# Patient Record
Sex: Female | Born: 1985
Health system: Southern US, Community
[De-identification: ages and names within clinical notes are randomized; demographics above are authoritative.]

## PROBLEM LIST (undated history)

## (undated) DIAGNOSIS — E669 Obesity, unspecified: Secondary | ICD-10-CM

## (undated) DIAGNOSIS — R7303 Prediabetes: Secondary | ICD-10-CM

## (undated) DIAGNOSIS — E559 Vitamin D deficiency, unspecified: Secondary | ICD-10-CM

## (undated) DIAGNOSIS — I10 Essential (primary) hypertension: Secondary | ICD-10-CM

## (undated) HISTORY — DX: Obesity, unspecified: E66.9

## (undated) HISTORY — DX: Vitamin D deficiency, unspecified: E55.9

## (undated) HISTORY — DX: Essential (primary) hypertension: I10

## (undated) HISTORY — DX: Prediabetes: R73.03

---
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
## 2013-10-28 ENCOUNTER — Other Ambulatory Visit: Payer: Self-pay | Admitting: Internal Medicine

## 2013-10-28 DIAGNOSIS — R946 Abnormal results of thyroid function studies: Secondary | ICD-10-CM

## 2013-10-29 ENCOUNTER — Ambulatory Visit
Admission: RE | Admit: 2013-10-29 | Discharge: 2013-10-29 | Disposition: A | Discharge status: Home / Self Care | Payer: BC Managed Care – PPO | Source: Ambulatory Visit | Attending: Internal Medicine | Admitting: Internal Medicine

## 2013-10-29 DIAGNOSIS — R946 Abnormal results of thyroid function studies: Secondary | ICD-10-CM

## 2013-12-24 ENCOUNTER — Other Ambulatory Visit: Payer: Self-pay | Admitting: Endocrinology

## 2013-12-24 DIAGNOSIS — E049 Nontoxic goiter, unspecified: Secondary | ICD-10-CM

## 2014-06-25 ENCOUNTER — Other Ambulatory Visit: Payer: BC Managed Care – PPO

## 2014-06-30 ENCOUNTER — Other Ambulatory Visit: Payer: BC Managed Care – PPO

## 2014-07-01 ENCOUNTER — Ambulatory Visit
Admission: RE | Admit: 2014-07-01 | Discharge: 2014-07-01 | Disposition: A | Discharge status: Home / Self Care | Payer: BC Managed Care – PPO | Source: Ambulatory Visit | Attending: Endocrinology | Admitting: Endocrinology

## 2014-07-01 DIAGNOSIS — E049 Nontoxic goiter, unspecified: Secondary | ICD-10-CM

## 2014-12-10 ENCOUNTER — Other Ambulatory Visit: Payer: Self-pay | Admitting: Endocrinology

## 2014-12-10 DIAGNOSIS — E049 Nontoxic goiter, unspecified: Secondary | ICD-10-CM

## 2015-06-30 ENCOUNTER — Other Ambulatory Visit: Payer: BC Managed Care – PPO

## 2015-07-07 ENCOUNTER — Ambulatory Visit
Admission: RE | Admit: 2015-07-07 | Discharge: 2015-07-07 | Disposition: A | Discharge status: Home / Self Care | Payer: BC Managed Care – PPO | Source: Ambulatory Visit | Attending: Endocrinology | Admitting: Endocrinology

## 2015-07-07 DIAGNOSIS — E049 Nontoxic goiter, unspecified: Secondary | ICD-10-CM

## 2016-07-19 DIAGNOSIS — E669 Obesity, unspecified: Secondary | ICD-10-CM | POA: Insufficient documentation

## 2016-07-19 DIAGNOSIS — Z6836 Body mass index (BMI) 36.0-36.9, adult: Secondary | ICD-10-CM | POA: Insufficient documentation

## 2017-08-06 ENCOUNTER — Other Ambulatory Visit: Payer: Self-pay | Admitting: Internal Medicine

## 2017-08-06 DIAGNOSIS — E041 Nontoxic single thyroid nodule: Secondary | ICD-10-CM

## 2017-08-24 ENCOUNTER — Ambulatory Visit
Admission: RE | Admit: 2017-08-24 | Discharge: 2017-08-24 | Disposition: A | Discharge status: Home / Self Care | Payer: BC Managed Care – PPO | Source: Ambulatory Visit | Attending: Internal Medicine | Admitting: Internal Medicine

## 2017-08-24 DIAGNOSIS — E041 Nontoxic single thyroid nodule: Secondary | ICD-10-CM

## 2018-05-16 ENCOUNTER — Encounter: Payer: Self-pay | Admitting: Internal Medicine

## 2018-06-06 ENCOUNTER — Other Ambulatory Visit: Payer: Self-pay | Admitting: Internal Medicine

## 2018-06-06 NOTE — Telephone Encounter (Signed)
Phentermine refill. 

## 2018-06-17 ENCOUNTER — Encounter: Payer: Self-pay | Admitting: Internal Medicine

## 2018-06-25 ENCOUNTER — Encounter: Payer: Self-pay | Admitting: Internal Medicine

## 2018-07-18 ENCOUNTER — Other Ambulatory Visit: Payer: Self-pay | Admitting: Internal Medicine

## 2018-11-26 ENCOUNTER — Telehealth: Payer: Self-pay

## 2018-11-26 NOTE — Telephone Encounter (Signed)
Pt consented to a virtual visit 11/26/18

## 2018-12-04 ENCOUNTER — Ambulatory Visit: Payer: Self-pay | Admitting: Internal Medicine

## 2019-09-04 ENCOUNTER — Ambulatory Visit: Payer: BC Managed Care – PPO

## 2019-09-08 ENCOUNTER — Ambulatory Visit: Payer: BC Managed Care – PPO | Attending: Family

## 2019-09-08 DIAGNOSIS — Z23 Encounter for immunization: Secondary | ICD-10-CM | POA: Insufficient documentation

## 2019-09-08 NOTE — Progress Notes (Signed)
   Covid-19 Vaccination Clinic  Name:  Kathleen Quinn    MRN: [id number] DOB: January 31, 1986  09/08/2019  [name] was observed post Covid-19 immunization for 15 minutes without incidence. She was provided with Vaccine Information Sheet and instruction to access the V-Safe system.   [name] was instructed to call 911 with any severe reactions post vaccine: Marland Kitchen Difficulty breathing  . Swelling of your face and throat  . A fast heartbeat  . A bad rash all over your body  . Dizziness and weakness    Immunizations Administered    Name Date Dose VIS Date Route   Moderna COVID-19 Vaccine 09/08/2019  9:15 AM 0.5 mL 06/17/2019 Intramuscular   Manufacturer: Moderna   Lot: [id number]   NDC: 89381-017-51

## 2019-10-07 ENCOUNTER — Ambulatory Visit: Payer: BC Managed Care – PPO | Attending: Family

## 2019-10-07 DIAGNOSIS — Z23 Encounter for immunization: Secondary | ICD-10-CM

## 2019-10-07 NOTE — Progress Notes (Signed)
   Covid-19 Vaccination Clinic  Name:  Kathleen Quinn    MRN: [id number] DOB: 1986/07/03  10/07/2019  [name] was observed post Covid-19 immunization for 15 minutes without incident. She was provided with Vaccine Information Sheet and instruction to access the V-Safe system.   [name] was instructed to call 911 with any severe reactions post vaccine: Marland Kitchen Difficulty breathing  . Swelling of face and throat  . A fast heartbeat  . A bad rash all over body  . Dizziness and weakness   Immunizations Administered    Name Date Dose VIS Date Route   Moderna COVID-19 Vaccine 10/07/2019 11:44 AM 0.5 mL 06/17/2019 Intramuscular   Manufacturer: Moderna   Lot: [id number]   NDC: 00634-949-44

## 2020-01-30 IMAGING — US US THYROID
1 series · 14 of 25 positions shown · non-contrast
Comparison: 07/07/2015 and previous back to 10/29/2013

CLINICAL DATA: Follow-up thyroid

EXAM:
THYROID ULTRASOUND
TECHNIQUE: Ultrasound examination of the thyroid gland and adjacent soft
tissues was performed.

[Series 1: us thyroid · 0.06mm/px · 14 of 50 slices shown]
[im 1/50]
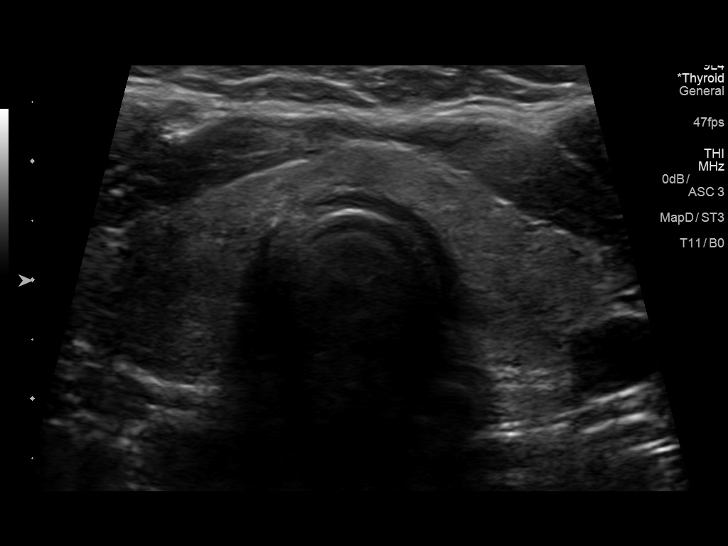
[im 5/50]
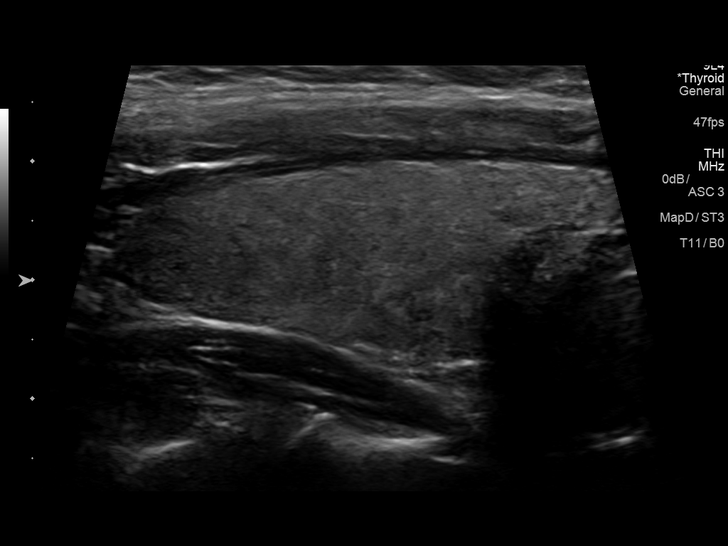
[im 9/50]
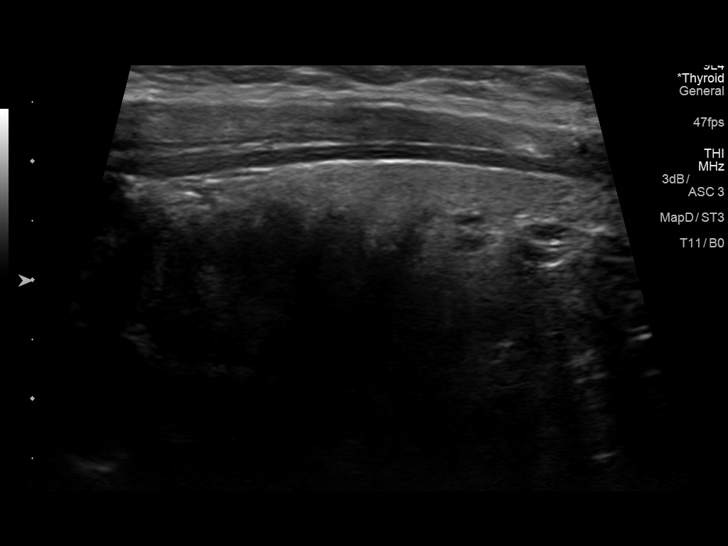
[im 13/50]
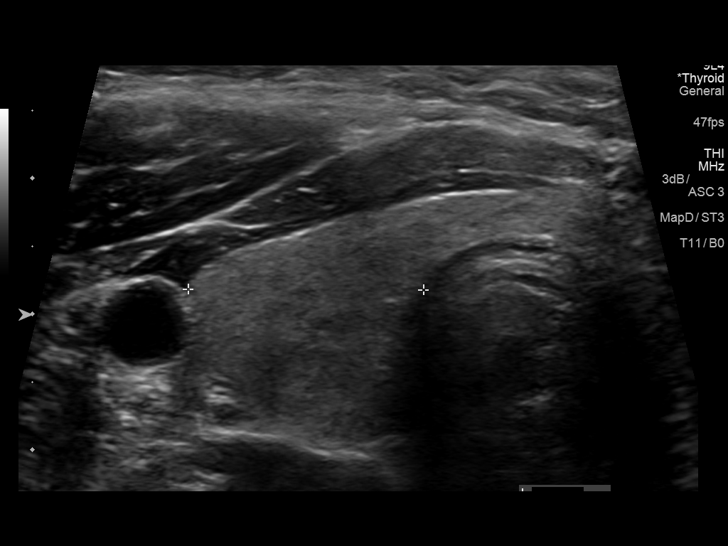
[im 17/50]
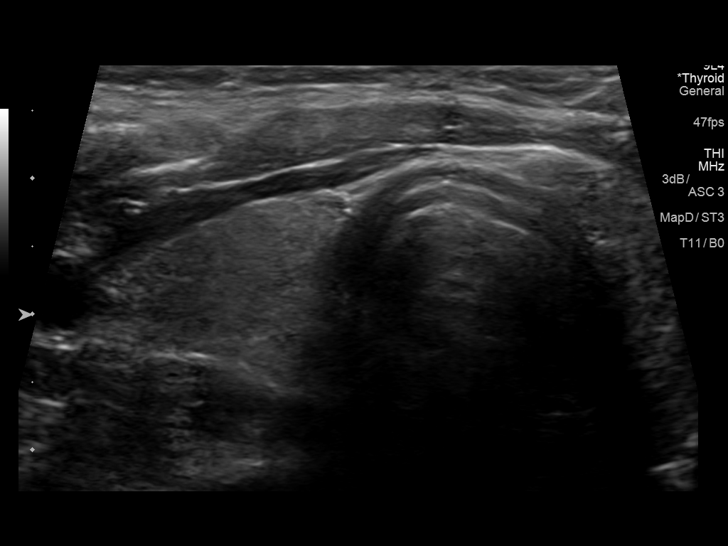
[im 19/50]
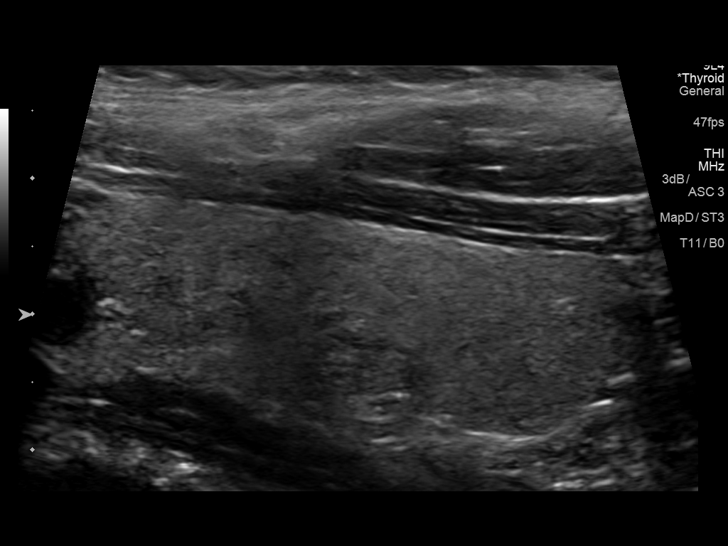
[im 23/50]
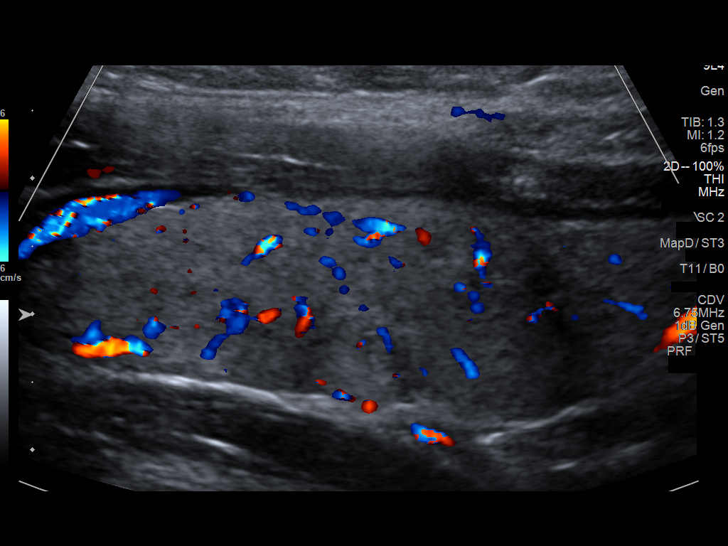
[im 27/50]
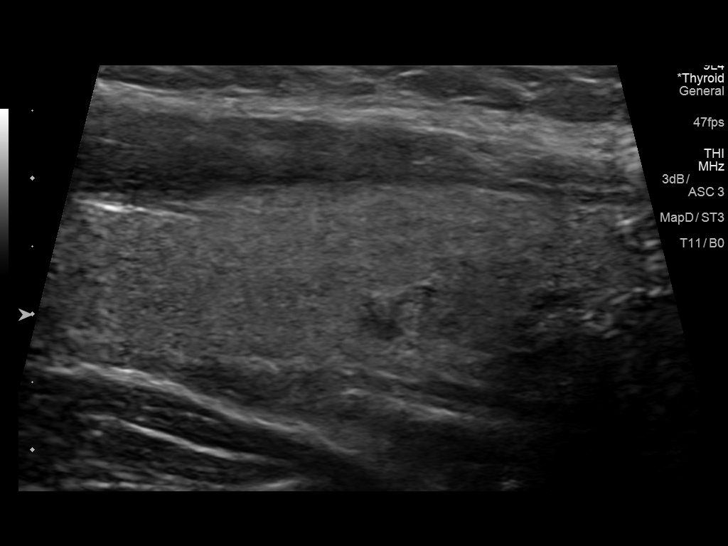
[im 31/50]
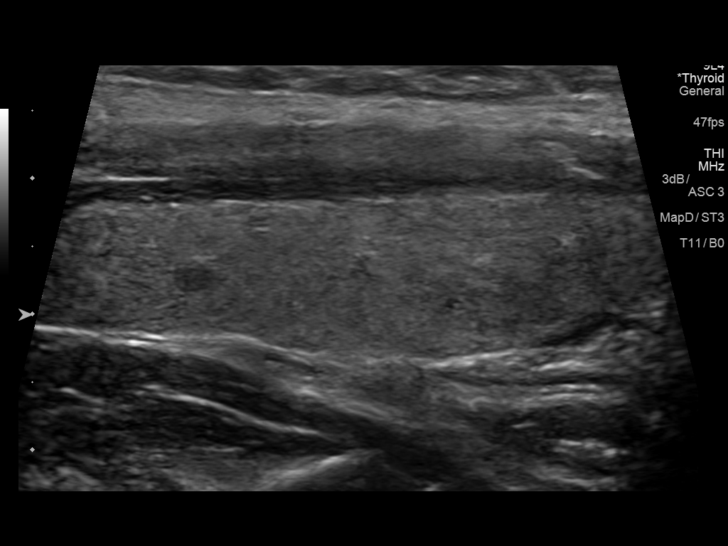
[im 33/50]
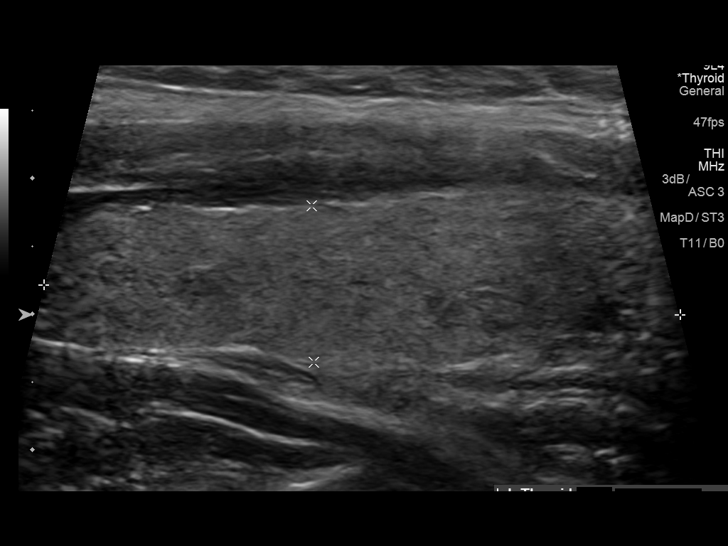
[im 37/50]
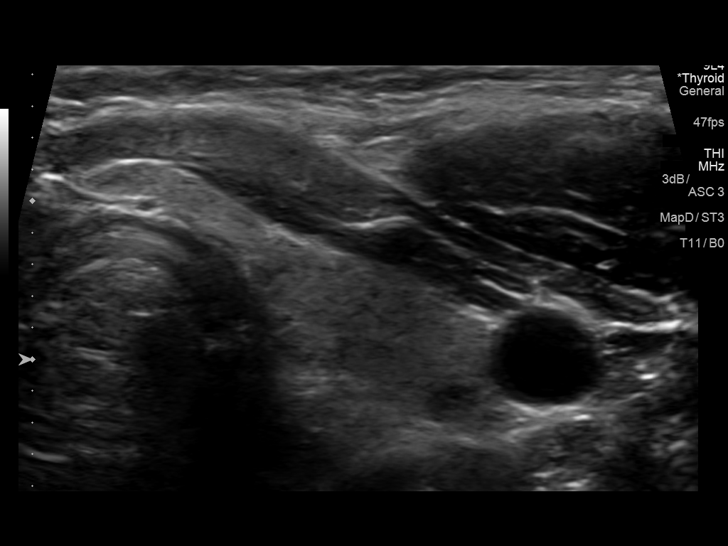
[im 41/50]
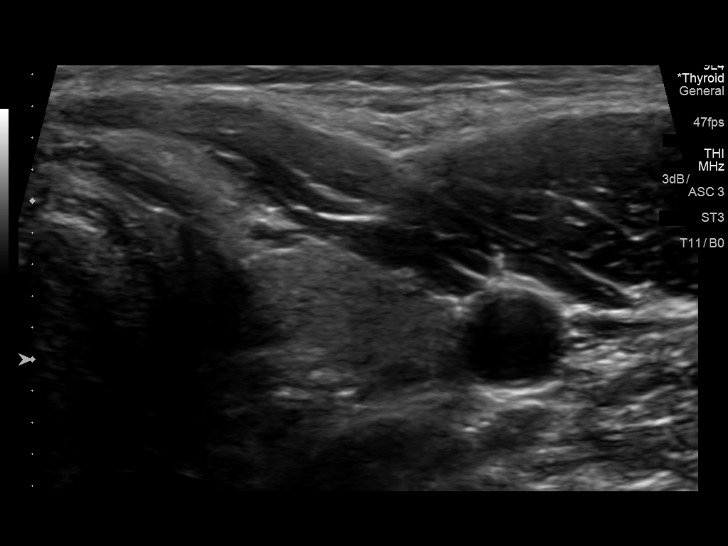
[im 45/50]
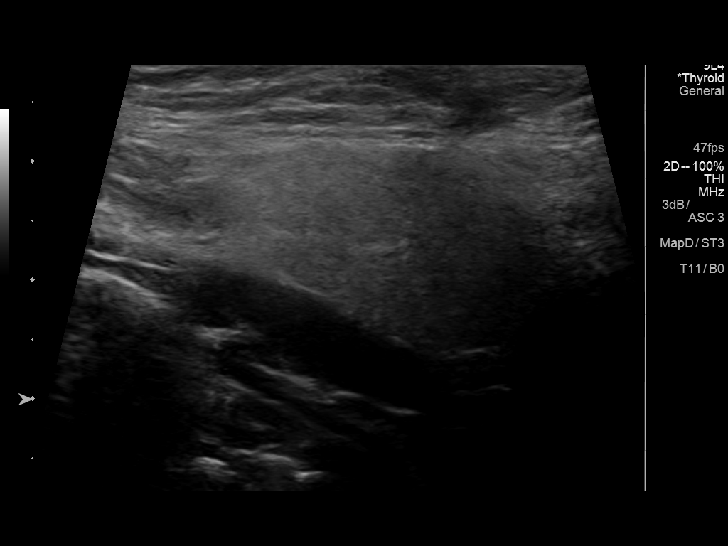
[im 50/50]
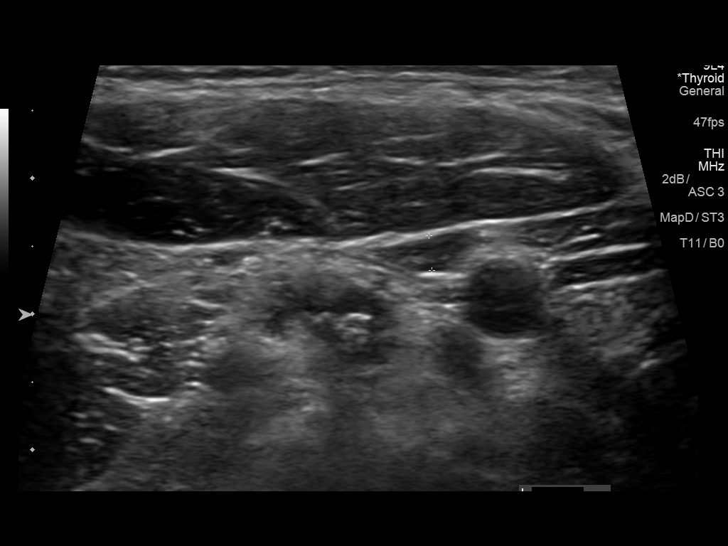

[14 of 25 positions shown; findings below may reference images not displayed]

FINDINGS: Parenchymal Echotexture: Mildly heterogenous

Isthmus: 0.4 cm thickness, stable

Right lobe: 5.4 x 1.7 x 1.7 cm, previously 5.4 x 1.6 x

Left lobe: 4.8 x 1.2 x 1.4 cm, previously 5.2 x 1.1 x

_________________________________________________________

Estimated total number of nodules >/= 1 cm: 0

Number of spongiform nodules >/=  2 cm not described below (TR1): 0

Number of mixed cystic and solid nodules >/= 1.5 cm not described
below (TR2): 0

_________________________________________________________

Isoechoic 0.7 cm mid right nodule with microcalcifications,
previously 0.7 on 10/29/2013

0.3 cm hypoechoic nodule or cyst, superior left;
IMPRESSION: 1. Stable thyromegaly. No worrisome findings to indicate need for
biopsy or dedicated imaging follow-up.

The above is in keeping with the ACR TI-RADS recommendations - [HOSPITAL] 6521;[DATE].

## 2020-04-07 ENCOUNTER — Ambulatory Visit: Payer: BC Managed Care – PPO | Attending: Family

## 2020-04-07 DIAGNOSIS — Z23 Encounter for immunization: Secondary | ICD-10-CM

## 2020-04-26 NOTE — Progress Notes (Signed)
   Covid-19 Vaccination Clinic  Name:  Kathleen Quinn    MRN: [id number] DOB: May 14, 1986  04/26/2020  Kathleen Quinn was observed post Covid-19 immunization for 15 minutes without incident. She was provided with Vaccine Information Sheet and instruction to access the V-Safe system.   Kathleen Quinn was instructed to call 911 with any severe reactions post vaccine: Marland Kitchen Difficulty breathing  . Swelling of face and throat  . A fast heartbeat  . A bad rash all over body  . Dizziness and weakness

## 2020-12-29 ENCOUNTER — Ambulatory Visit (INDEPENDENT_AMBULATORY_CARE_PROVIDER_SITE_OTHER): Payer: 59 | Admitting: Nurse Practitioner

## 2020-12-29 ENCOUNTER — Other Ambulatory Visit: Payer: Self-pay

## 2020-12-29 ENCOUNTER — Encounter: Payer: Self-pay | Admitting: Nurse Practitioner

## 2020-12-29 VITALS — BP 128/80 | HR 83 | Temp 98.4°F | Ht 62.2 in | Wt 198.2 lb

## 2020-12-29 DIAGNOSIS — E559 Vitamin D deficiency, unspecified: Secondary | ICD-10-CM | POA: Diagnosis not present

## 2020-12-29 DIAGNOSIS — Z7689 Persons encountering health services in other specified circumstances: Secondary | ICD-10-CM | POA: Diagnosis not present

## 2020-12-29 DIAGNOSIS — Z0000 Encounter for general adult medical examination without abnormal findings: Secondary | ICD-10-CM

## 2020-12-29 DIAGNOSIS — E6609 Other obesity due to excess calories: Secondary | ICD-10-CM

## 2020-12-29 DIAGNOSIS — Z6836 Body mass index (BMI) 36.0-36.9, adult: Secondary | ICD-10-CM | POA: Diagnosis not present

## 2020-12-29 MED ORDER — PHENTERMINE HCL 37.5 MG PO TABS: ORAL_TABLET | ORAL | 0 refills | Status: DC

## 2020-12-29 NOTE — Patient Instructions (Signed)
Health Maintenance, Female Adopting a healthy lifestyle and getting preventive care are important in promoting health and wellness. Ask your health care provider about: The right schedule for you to have regular tests and exams. Things you can do on your own to prevent diseases and keep yourself healthy. What should I know about diet, weight, and exercise? Eat a healthy diet  Eat a diet that includes plenty of vegetables, fruits, low-fat dairy products, and lean protein. Do not eat a lot of foods that are high in solid fats, added sugars, or sodium.  Maintain a healthy weight Body mass index (BMI) is used to identify weight problems. It estimates body fat based on height and weight. Your health care provider can help determineyour BMI and help you achieve or maintain a healthy weight. Get regular exercise Get regular exercise. This is one of the most important things you can do for your health. Most adults should: Exercise for at least 150 minutes each week. The exercise should increase your heart rate and make you sweat (moderate-intensity exercise). Do strengthening exercises at least twice a week. This is in addition to the moderate-intensity exercise. Spend less time sitting. Even light physical activity can be beneficial. Watch cholesterol and blood lipids Have your blood tested for lipids and cholesterol at 37 years of age, then havethis test every 5 years. Have your cholesterol levels checked more often if: Your lipid or cholesterol levels are high. You are older than 37 years of age. You are at high risk for heart disease. What should I know about cancer screening? Depending on your health history and family history, you may need to have cancer screening at various ages. This may include screening for: Breast cancer. Cervical cancer. Colorectal cancer. Skin cancer. Lung cancer. What should I know about heart disease, diabetes, and high blood pressure? Blood pressure and heart  disease High blood pressure causes heart disease and increases the risk of stroke. This is more likely to develop in people who have high blood pressure readings, are of African descent, or are overweight. Have your blood pressure checked: Every 3-5 years if you are 18-39 years of age. Every year if you are 40 years old or older. Diabetes Have regular diabetes screenings. This checks your fasting blood sugar level. Have the screening done: Once every three years after age 40 if you are at a normal weight and have a low risk for diabetes. More often and at a younger age if you are overweight or have a high risk for diabetes. What should I know about preventing infection? Hepatitis B If you have a higher risk for hepatitis B, you should be screened for this virus. Talk with your health care provider to find out if you are at risk forhepatitis B infection. Hepatitis C Testing is recommended for: Everyone born from 1945 through 1965. Anyone with known risk factors for hepatitis C. Sexually transmitted infections (STIs) Get screened for STIs, including gonorrhea and chlamydia, if: You are sexually active and are younger than 37 years of age. You are older than 37 years of age and your health care provider tells you that you are at risk for this type of infection. Your sexual activity has changed since you were last screened, and you are at increased risk for chlamydia or gonorrhea. Ask your health care provider if you are at risk. Ask your health care provider about whether you are at high risk for HIV. Your health care provider may recommend a prescription medicine to help   prevent HIV infection. If you choose to take medicine to prevent HIV, you should first get tested for HIV. You should then be tested every 3 months for as long as you are taking the medicine. Pregnancy If you are about to stop having your period (premenopausal) and you may become pregnant, seek counseling before you get  pregnant. Take 400 to 800 micrograms (mcg) of folic acid every day if you become pregnant. Ask for birth control (contraception) if you want to prevent pregnancy. Osteoporosis and menopause Osteoporosis is a disease in which the bones lose minerals and strength with aging. This can result in bone fractures. If you are 65 years old or older, or if you are at risk for osteoporosis and fractures, ask your health care provider if you should: Be screened for bone loss. Take a calcium or vitamin D supplement to lower your risk of fractures. Be given hormone replacement therapy (HRT) to treat symptoms of menopause. Follow these instructions at home: Lifestyle Do not use any products that contain nicotine or tobacco, such as cigarettes, e-cigarettes, and chewing tobacco. If you need help quitting, ask your health care provider. Do not use street drugs. Do not share needles. Ask your health care provider for help if you need support or information about quitting drugs. Alcohol use Do not drink alcohol if: Your health care provider tells you not to drink. You are pregnant, may be pregnant, or are planning to become pregnant. If you drink alcohol: Limit how much you use to 0-1 drink a day. Limit intake if you are breastfeeding. Be aware of how much alcohol is in your drink. In the [place] one drink equals one 12 oz bottle of beer (355 mL), one 5 oz glass of wine (148 mL), or one 1 oz glass of hard liquor (44 mL). General instructions Schedule regular health, dental, and eye exams. Stay current with your vaccines. Tell your health care provider if: You often feel depressed. You have ever been abused or do not feel safe at home. Summary Adopting a healthy lifestyle and getting preventive care are important in promoting health and wellness. Follow your health care provider's instructions about healthy diet, exercising, and getting tested or screened for diseases. Follow your health care provider's  instructions on monitoring your cholesterol and blood pressure. This information is not intended to replace advice given to you by your health care provider. Make sure you discuss any questions you have with your healthcare provider. Document Revised: 06/26/2018 Document Reviewed: 06/26/2018 Elsevier Patient Education  2022 Elsevier Inc.  

## 2020-12-29 NOTE — Progress Notes (Signed)
I,Tianna Badgett,acting as a Education administrator for [employer], NP.,have documented all relevant documentation on the behalf of [employer], NP,as directed by  Bary Castilla, NP while in the presence of Bary Castilla, NP.  This visit occurred during the SARS-CoV-2 public health emergency.  Safety protocols were in place, including screening questions prior to the visit, additional usage of staff PPE, and extensive cleaning of exam room while observing appropriate contact time as indicated for disinfecting solutions.  Subjective:     Patient ID: Kathleen Quinn , female    DOB: 11/08/1985 , 35 y.o.   MRN: [id number]   Chief Complaint  Patient presents with   Establish Care    HPI  Patient Is here to establish care. She used to see Dr. Baird Cancer before 2-3 years. She would like a physical today. She is followed by Dr Hurshel Keys at [facility].  Diet: She eats pretty healthy. She cooks more.  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  Sexually active:  Yes  No contraceptive  LMP: 11/28/20  Smoke: No  Drink:no  Eye Doctor: every year  Dentist: Yes     No past medical history on file.   Family History  Problem Relation Age of Onset   Hypertension Mother    Cancer Father    Cancer Maternal Grandmother      Current Outpatient Medications:    phentermine (ADIPEX-P) 37.5 MG tablet, Take 1/2 tablet by mouth every day in the morning., Disp: 30 tablet, Rfl: 0   No Known Allergies    The patient states she uses none for birth control. Last LMP was Patient's last menstrual period was 11/27/2020.. Negative for Dysmenorrhea. Negative for: breast discharge, breast lump(s), breast pain and breast self exam. Associated symptoms include abnormal vaginal bleeding. Pertinent negatives include abnormal bleeding (hematology), anxiety, decreased libido, depression, difficulty falling sleep, dyspareunia, history of infertility, nocturia, sexual dysfunction, sleep disturbances,  urinary incontinence, urinary urgency, vaginal discharge and vaginal itching. Diet regular.The patient states her exercise level is    . The patient's tobacco use is:  Social History   Tobacco Use  Smoking Status Never  Smokeless Tobacco Never  . She has been exposed to passive smoke. The patient's alcohol use is:  Social History   Substance and Sexual Activity  Alcohol Use Never  Additional information: Last pap has been couple of years, next one scheduled for this year. She is currently scheduled to see a OBGYN this summer.   Review of Systems  Constitutional: Negative.  Negative for chills, fatigue and fever.  HENT:  Negative for congestion.   Respiratory: Negative.  Negative for apnea, cough, choking, shortness of breath and wheezing.   Cardiovascular: Negative.  Negative for chest pain and palpitations.  Gastrointestinal: Negative.  Negative for constipation, diarrhea and nausea.  Endocrine: Negative for polydipsia, polyphagia and polyuria.  Musculoskeletal:  Negative for back pain and myalgias.  Neurological: Negative.  Negative for dizziness, weakness, numbness and headaches.  Psychiatric/Behavioral: Negative.      Today's Vitals   12/29/20 1600 12/29/20 1626  BP: 128/80   Pulse: (!) 102 83  Temp: 98.4 F (36.9 C)   TempSrc: Oral   Weight: 198 lb 3.2 oz (89.9 kg)   Height: 5' 2.2" (1.58 m)    Body mass index is 36.02 kg/m.  Wt Readings from Last 3 Encounters:  12/29/20 198 lb 3.2 oz (89.9 kg)    Objective:  Physical Exam Vitals and nursing note reviewed.  Constitutional:  Appearance: Normal appearance.  HENT:     Head: Normocephalic and atraumatic.     Right Ear: Tympanic membrane, ear canal and external ear normal. There is no impacted cerumen.     Left Ear: Tympanic membrane, ear canal and external ear normal. There is no impacted cerumen.     Nose: Nose normal.     Mouth/Throat:     Mouth: Mucous membranes are moist.     Pharynx: Oropharynx is clear.   Eyes:     Extraocular Movements: Extraocular movements intact.     Conjunctiva/sclera: Conjunctivae normal.     Pupils: Pupils are equal, round, and reactive to light.  Cardiovascular:     Rate and Rhythm: Normal rate and regular rhythm.     Pulses: Normal pulses.     Heart sounds: Normal heart sounds. No murmur heard. Pulmonary:     Effort: Pulmonary effort is normal. No respiratory distress.     Breath sounds: Normal breath sounds. No wheezing.  Abdominal:     General: Abdomen is flat. Bowel sounds are normal.     Palpations: Abdomen is soft.  Genitourinary:    Comments: Deferred.She sees a OBGYN  Musculoskeletal:        General: Normal range of motion.     Cervical back: Normal range of motion and neck supple.  Skin:    General: Skin is warm and dry.     Capillary Refill: Capillary refill takes less than 2 seconds.  Neurological:     General: No focal deficit present.     Mental Status: She is alert and oriented to person, place, and time.  Psychiatric:        Mood and Affect: Mood normal.        Behavior: Behavior normal.        Assessment And Plan:     1. Establishing care with new doctor, encounter for --Patient is here to establish care. Martin Majestic over patient medical, family, social and surgical history. -Reviewed with patient their medications and any allergies  -Reviewed with patient their sexual orientation, drug/tobacco and alcohol use -Dicussed any new concerns with patient  -recommended patient comes in for a physical exam and complete blood work.  -Educated patient about the importance of annual screenings and immunizations.  -Advised patient to eat a healthy diet along with exercise for atleast 30-45 min atleast 4-5 days of the week.   2. Encounter for annual physical exam --Patient is here for their annual physical exam and we discussed any changes to medication and medical history.  -Behavior modification was discussed as well as diet and exercise history   -Patient will continue to exercise regularly and modify their diet.  -Recommendation for yearly physical annuals, immunization and screenings including mammogram and colonoscopy were discussed with the patient.  -Recommended intake of multivitamin, vitamin D and calcium.  -Individualized advise was given to the patient pertaining to their own health history in regards to diet, exercise, medical condition and referrals.  - CBC - Hemoglobin A1c - CMP14+EGFR - Lipid panel - Hepatitis C antibody - HIV Antibody (routine testing w rflx)  3. Vitamin D deficiency -Will check and supplement if needed. Advised patient to spend atleast 15 min. Daily in sunlight.  - Vitamin D (25 hydroxy)  4. Class 2 obesity due to excess calories without serious comorbidity with body mass index (BMI) of 36.0 to 36.9 in adult -She as on phentermine the last time she was here. She only tried it for a month and  would like to try it again.  - phentermine (ADIPEX-P) 37.5 MG tablet; Take 1/2 tablet by mouth every day in the morning.  Dispense: 30 tablet; Refill: 0 Advised patient on a healthy diet including avoiding fast food and red meats. Increase the intake of lean meats including grilled chicken and [place].  Drink a lot of water. Decrease intake of fatty foods. Exercise for 30-45 min. 4-5 a week to decrease the risk of cardiac event.  -Follow up: 1 month   The patient was encouraged to call or send a message through [place] for any questions or concerns.   Side effects and appropriate use of all the medication(s) were discussed with the patient today. Patient advised to use the medication(s) as directed by their healthcare provider. The patient was encouraged to read, review, and understand all associated package inserts and contact our office with any questions or concerns. The patient accepts the risks of the treatment plan and had an opportunity to ask questions.   Patient was given opportunity to ask questions.  Patient verbalized understanding of the plan and was able to repeat key elements of the plan. All questions were answered to their satisfaction.  Raman Kayann Maj, DNP   I, Raman Nikeisha Klutz have reviewed all documentation for this visit. The documentation on 12/29/20 for the exam, diagnosis, procedures, and orders are all accurate and complete.    THE PATIENT IS ENCOURAGED TO PRACTICE SOCIAL DISTANCING DUE TO THE COVID-19 PANDEMIC.

## 2020-12-30 ENCOUNTER — Other Ambulatory Visit: Payer: Self-pay | Admitting: Nurse Practitioner

## 2020-12-30 DIAGNOSIS — E559 Vitamin D deficiency, unspecified: Secondary | ICD-10-CM

## 2020-12-30 LAB — CMP14+EGFR
ALT: 22 IU/L (ref 0–32)
AST: 20 IU/L (ref 0–40)
Albumin/Globulin Ratio: 1.9 (ref 1.2–2.2)
Albumin: 4.8 g/dL (ref 3.8–4.8)
Alkaline Phosphatase: 60 IU/L (ref 44–121)
BUN/Creatinine Ratio: 11 (ref 9–23)
BUN: 10 mg/dL (ref 6–20)
Bilirubin Total: <0.2 mg/dL (ref 0.0–1.2)
CO2: 22 mmol/L (ref 20–29)
Calcium: 10.1 mg/dL (ref 8.7–10.2)
Chloride: 103 mmol/L (ref 96–106)
Creatinine, Ser: 0.87 mg/dL (ref 0.57–1.00)
Globulin, Total: 2.5 g/dL (ref 1.5–4.5)
Glucose: 94 mg/dL (ref 65–99)
Potassium: 4.2 mmol/L (ref 3.5–5.2)
Sodium: 141 mmol/L (ref 134–144)
Total Protein: 7.3 g/dL (ref 6.0–8.5)
eGFR: 88 mL/min/{1.73_m2} (ref >59)

## 2020-12-30 LAB — HIV ANTIBODY (ROUTINE TESTING W REFLEX): HIV Screen 4th Generation wRfx: NONREACTIVE

## 2020-12-30 LAB — CBC
Hematocrit: 39.5 % (ref 34.0–46.6)
Hemoglobin: 12.8 g/dL (ref 11.1–15.9)
MCH: 27 pg (ref 26.6–33.0)
MCHC: 32.4 g/dL (ref 31.5–35.7)
MCV: 83 fL (ref 79–97)
Platelets: 230 10*3/uL (ref 150–450)
RBC: 4.74 x10E6/uL (ref 3.77–5.28)
RDW: 12.8 % (ref 11.7–15.4)
WBC: 6.1 10*3/uL (ref 3.4–10.8)

## 2020-12-30 LAB — HEMOGLOBIN A1C
Est. average glucose Bld gHb Est-mCnc: 126 mg/dL
Hgb A1c MFr Bld: 6 % — ABNORMAL HIGH (ref 4.8–5.6)

## 2020-12-30 LAB — LIPID PANEL
Chol/HDL Ratio: 3 ratio (ref 0.0–4.4)
Cholesterol, Total: 198 mg/dL (ref 100–199)
HDL: 65 mg/dL (ref >39)
LDL Chol Calc (NIH): 113 mg/dL — ABNORMAL HIGH (ref 0–99)
Triglycerides: 111 mg/dL (ref 0–149)
VLDL Cholesterol Cal: 20 mg/dL (ref 5–40)

## 2020-12-30 LAB — HEPATITIS C ANTIBODY: Hep C Virus Ab: <0.1 s/co ratio (ref 0.0–0.9)

## 2020-12-30 LAB — VITAMIN D 25 HYDROXY (VIT D DEFICIENCY, FRACTURES): Vit D, 25-Hydroxy: 26.1 ng/mL — ABNORMAL LOW (ref 30.0–100.0)

## 2020-12-30 MED ORDER — VITAMIN D (ERGOCALCIFEROL) 1.25 MG (50000 UNIT) PO CAPS: 50000.0000 [IU] | ORAL_CAPSULE | ORAL | 0 refills | Status: DC

## 2020-12-30 NOTE — Progress Notes (Signed)
Vit D Rx sent 

## 2021-01-19 ENCOUNTER — Ambulatory Visit: Payer: 59 | Admitting: Nurse Practitioner

## 2021-01-19 ENCOUNTER — Other Ambulatory Visit: Payer: Self-pay

## 2021-01-19 VITALS — BP 122/68 | HR 88 | Temp 98.6°F | Ht 61.2 in | Wt 191.4 lb

## 2021-01-19 DIAGNOSIS — Z6835 Body mass index (BMI) 35.0-35.9, adult: Secondary | ICD-10-CM

## 2021-01-19 DIAGNOSIS — E6609 Other obesity due to excess calories: Secondary | ICD-10-CM

## 2021-01-19 MED ORDER — PHENTERMINE HCL 37.5 MG PO TABS: ORAL_TABLET | ORAL | 0 refills | Status: DC

## 2021-01-19 NOTE — Patient Instructions (Signed)
Obesity, Adult Obesity is having too much body fat. Being obese means that your weight is morethan what is healthy for you. BMI is a number that explains how much body fat you have. If you have a BMI of 30 or more, you are obese. Obesity is often caused by eating or drinking morecalories than your body uses. Changing your lifestyle can help you lose weight. Obesity can cause serious health problems, such as: Stroke. Coronary artery disease (CAD). Type 2 diabetes. Some types of cancer, including cancers of the colon, breast, uterus, and gallbladder. Osteoarthritis. High blood pressure (hypertension). High cholesterol. Sleep apnea. Gallbladder stones. Infertility problems. What are the causes? Eating meals each day that are high in calories, sugar, and fat. Being born with genes that may make you more likely to become obese. Having a medical condition that causes obesity. Taking certain medicines. Sitting a lot (having a sedentary lifestyle). Not getting enough sleep. Drinking a lot of drinks that have sugar in them. What increases the risk? Having a family history of obesity. Being an African American woman. Being a Hispanic man. Living in an area with limited access to: Parks, recreation centers, or sidewalks. Healthy food choices, such as grocery stores and farmers' markets. What are the signs or symptoms? The main sign is having too much body fat. How is this treated? Treatment for this condition often includes changing your lifestyle. Treatment may include: Changing your diet. This may include making a healthy meal plan. Exercise. This may include activity that causes your heart to beat faster (aerobic exercise) and strength training. Work with your doctor to design a program that works for you. Medicine to help you lose weight. This may be used if you are not able to lose 1 pound a week after 6 weeks of healthy eating and more exercise. Treating conditions that cause the  obesity. Surgery. Options may include gastric banding and gastric bypass. This may be done if: Other treatments have not helped to improve your condition. You have a BMI of 40 or higher. You have life-threatening health problems related to obesity. Follow these instructions at home: Eating and drinking  Follow advice from your doctor about what to eat and drink. Your doctor may tell you to: Limit fast food, sweets, and processed snack foods. Choose low-fat options. For example, choose low-fat milk instead of whole milk. Eat 5 or more servings of fruits or vegetables each day. Eat at home more often. This gives you more control over what you eat. Choose healthy foods when you eat out. Learn to read food labels. This will help you learn how much food is in 1 serving. Keep low-fat snacks available. Avoid drinks that have a lot of sugar in them. These include soda, fruit juice, iced tea with sugar, and flavored milk. Drink enough water to keep your pee (urine) pale yellow. Do not go on fad diets.  Physical activity Exercise often, as told by your doctor. Most adults should get up to 150 minutes of moderate-intensity exercise every week.Ask your doctor: What types of exercise are safe for you. How often you should exercise. Warm up and stretch before being active. Do slow stretching after being active (cool down). Rest between times of being active. Lifestyle Work with your doctor and a food expert (dietitian) to set a weight-loss goal that is best for you. Limit your screen time. Find ways to reward yourself that do not involve food. Do not drink alcohol if: Your doctor tells you not to drink.   You are pregnant, may be pregnant, or are planning to become pregnant. If you drink alcohol: Limit how much you use to: 0-1 drink a day for women. 0-2 drinks a day for men. Be aware of how much alcohol is in your drink. In the [place] one drink equals one 12 oz bottle of beer (355 mL), one 5 oz  glass of wine (148 mL), or one 1 oz glass of hard liquor (44 mL). General instructions Keep a weight-loss journal. This can help you keep track of: The food that you eat. How much exercise you get. Take over-the-counter and prescription medicines only as told by your doctor. Take vitamins and supplements only as told by your doctor. Think about joining a support group. Keep all follow-up visits as told by your doctor. This is important. Contact a doctor if: You cannot meet your weight loss goal after you have changed your diet and lifestyle for 6 weeks. Get help right away if you: Are having trouble breathing. Are having thoughts of harming yourself. Summary Obesity is having too much body fat. Being obese means that your weight is more than what is healthy for you. Work with your doctor to set a weight-loss goal. Get regular exercise as told by your doctor. This information is not intended to replace advice given to you by your health care provider. Make sure you discuss any questions you have with your healthcare provider. Document Revised: 03/07/2018 Document Reviewed: 03/07/2018 Elsevier Patient Education  2022 Elsevier Inc.  

## 2021-01-19 NOTE — Progress Notes (Signed)
I,Tianna Badgett,acting as a Neurosurgeon for [employer], NP.,have documented all relevant documentation on the behalf of [employer], NP,as directed by  Charlesetta Ivory, NP while in the presence of Charlesetta Ivory, NP.  This visit occurred during the SARS-CoV-2 public health emergency.  Safety protocols were in place, including screening questions prior to the visit, additional usage of staff PPE, and extensive cleaning of exam room while observing appropriate contact time as indicated for disinfecting solutions.  Subjective:     Patient ID: Kathleen Quinn , female    DOB: 24-Jan-1986 , 35 y.o.   MRN: [id number]   Chief Complaint  Patient presents with   Obesity    HPI  Patient is here for follow up with her weight. She is currently taking phentermine. She is doing good with it. No side-effects. No heart palpitations . Diet: she has been eating more salads. She is trying to stay healthy and more chicken. No pork. Exercise: she has been walking more. Wt Readings from Last 3 Encounters: 01/19/21 : 191 lb 6.4 oz (86.8 kg) 12/29/20 : 198 lb 3.2 oz (89.9 kg)     No past medical history on file.   Family History  Problem Relation Age of Onset   Hypertension Mother    Cancer Father    Cancer Maternal Grandmother      Current Outpatient Medications:    Vitamin D, Ergocalciferol, (DRISDOL) 1.25 MG (50000 UNIT) CAPS capsule, Take 1 capsule (50,000 Units total) by mouth every 7 (seven) days., Disp: 12 capsule, Rfl: 0   phentermine (ADIPEX-P) 37.5 MG tablet, Take 1/2 tablet by mouth every day in the morning., Disp: 30 tablet, Rfl: 0   No Known Allergies   Review of Systems  Constitutional: Negative.  Negative for chills and fever.  HENT:  Negative for congestion, sinus pressure and sinus pain.   Respiratory: Negative.  Negative for cough, shortness of breath and wheezing.   Cardiovascular: Negative.  Negative for chest pain and palpitations.  Gastrointestinal:  Negative.   Musculoskeletal:  Negative for arthralgias and myalgias.  Neurological: Negative.  Negative for dizziness, weakness and headaches.    Today's Vitals   01/19/21 1412  BP: 122/68  Pulse: 88  Temp: 98.6 F (37 C)  TempSrc: Oral  Weight: 191 lb 6.4 oz (86.8 kg)  Height: 5' 1.2" (1.554 m)   Body mass index is 35.93 kg/m.  Wt Readings from Last 3 Encounters:  01/19/21 191 lb 6.4 oz (86.8 kg)  12/29/20 198 lb 3.2 oz (89.9 kg)    Objective:  Physical Exam Constitutional:      Appearance: Normal appearance. She is obese.  HENT:     Head: Normocephalic and atraumatic.  Cardiovascular:     Rate and Rhythm: Normal rate and regular rhythm.     Pulses: Normal pulses.     Heart sounds: Normal heart sounds. No murmur heard. Pulmonary:     Effort: Pulmonary effort is normal. No respiratory distress.     Breath sounds: Normal breath sounds. No wheezing or rales.  Skin:    General: Skin is warm and dry.     Capillary Refill: Capillary refill takes less than 2 seconds.  Neurological:     Mental Status: She is alert and oriented to person, place, and time.        Assessment And Plan:     1. Class 2 obesity due to excess calories without serious comorbidity with body mass index (BMI) of 35.0 to 35.9 in adult - phentermine (ADIPEX-P)  37.5 MG tablet; Take 1/2 tablet by mouth every day in the morning.  Dispense: 30 tablet; Refill: 0  -Discussed side-effects of medication with patient. She verbalized understanding.  -Reviewed BP and HR at today's visit.  -No other concerns today.  -Advised her to continue exercise and diet  -Hydrate with water.   The patient was encouraged to call or send a message through MyChart for any questions or concerns.   Follow up: if symptoms persist or do not get better.   Side effects and appropriate use of all the medication(s) were discussed with the patient today. Patient advised to use the medication(s) as directed by their healthcare  provider. The patient was encouraged to read, review, and understand all associated package inserts and contact our office with any questions or concerns. The patient accepts the risks of the treatment plan and had an opportunity to ask questions.   Patient was given opportunity to ask questions. Patient verbalized understanding of the plan and was able to repeat key elements of the plan. All questions were answered to their satisfaction.  Raman Suzan Manon, DNP   I, Raman Zahari Xiang have reviewed all documentation for this visit. The documentation on 01/19/21 for the exam, diagnosis, procedures, and orders are all accurate and complete.    IF YOU HAVE BEEN REFERRED TO A SPECIALIST, IT MAY TAKE 1-2 WEEKS TO SCHEDULE/PROCESS THE REFERRAL. IF YOU HAVE NOT HEARD FROM US/SPECIALIST IN TWO WEEKS, PLEASE GIVE [place] A CALL AT [phone].   THE PATIENT IS ENCOURAGED TO PRACTICE SOCIAL DISTANCING DUE TO THE COVID-19 PANDEMIC.

## 2021-01-27 ENCOUNTER — Other Ambulatory Visit: Payer: Self-pay

## 2021-01-27 DIAGNOSIS — E6609 Other obesity due to excess calories: Secondary | ICD-10-CM

## 2021-02-01 ENCOUNTER — Other Ambulatory Visit: Payer: Self-pay

## 2021-02-01 MED ORDER — PHENTERMINE HCL 37.5 MG PO TABS: ORAL_TABLET | ORAL | 0 refills | Status: DC

## 2021-02-01 NOTE — Telephone Encounter (Signed)
Refill sent. She would need to make a follow up appt. Patient educated to closely monitor her BP and HR while taking this med.

## 2021-02-01 NOTE — Telephone Encounter (Signed)
Let the patient know that I have sent in the prescription. And she would need to make an appt for next time for a follow up. Let the patient know she needs to closely monitor her BP and HR while taking this medication.

## 2021-02-22 DIAGNOSIS — Z01419 Encounter for gynecological examination (general) (routine) without abnormal findings: Secondary | ICD-10-CM | POA: Diagnosis not present

## 2021-02-22 DIAGNOSIS — Z6834 Body mass index (BMI) 34.0-34.9, adult: Secondary | ICD-10-CM | POA: Diagnosis not present

## 2021-02-22 LAB — HM PAP SMEAR

## 2021-03-22 ENCOUNTER — Ambulatory Visit: Payer: 59 | Admitting: Nurse Practitioner

## 2021-03-23 ENCOUNTER — Encounter: Payer: Self-pay | Admitting: Nurse Practitioner

## 2021-03-23 ENCOUNTER — Other Ambulatory Visit: Payer: Self-pay

## 2021-03-23 ENCOUNTER — Ambulatory Visit: Payer: 59 | Admitting: Nurse Practitioner

## 2021-03-23 VITALS — BP 128/80 | HR 88 | Ht 61.0 in | Wt 180.4 lb

## 2021-03-23 DIAGNOSIS — E6609 Other obesity due to excess calories: Secondary | ICD-10-CM

## 2021-03-23 DIAGNOSIS — E559 Vitamin D deficiency, unspecified: Secondary | ICD-10-CM | POA: Diagnosis not present

## 2021-03-23 DIAGNOSIS — Z23 Encounter for immunization: Secondary | ICD-10-CM | POA: Diagnosis not present

## 2021-03-23 DIAGNOSIS — Z6835 Body mass index (BMI) 35.0-35.9, adult: Secondary | ICD-10-CM

## 2021-03-23 MED ORDER — SAXENDA 18 MG/3ML ~~LOC~~ SOPN: 0.6000 mg | PEN_INJECTOR | Freq: Every day | SUBCUTANEOUS | 0 refills | Status: DC

## 2021-03-23 MED ORDER — VITAMIN D (ERGOCALCIFEROL) 1.25 MG (50000 UNIT) PO CAPS: 50000.0000 [IU] | ORAL_CAPSULE | ORAL | 0 refills | Status: DC

## 2021-03-23 NOTE — Progress Notes (Signed)
[facility]	[place] as a Neurosurgeon for [employer], NP.,have documented all relevant documentation on the behalf of [employer], NP,as directed by  Charlesetta Ivory, NP while in the presence of Charlesetta Ivory, NP.  This visit occurred during the SARS-CoV-2 public health emergency.  Safety protocols were in place, including screening questions prior to the visit, additional usage of staff PPE, and extensive cleaning of exam room while observing appropriate contact time as indicated for disinfecting solutions.  Subjective:     Patient ID: Kathleen Quinn , female    DOB: [birth year] , 35 y.o.   MRN: [id number]   No chief complaint on file.   HPI  Pt presents today for a weight check. She would like to get her flu shot today as well.  She is doing well with the phentermine however she would like to try the saxenda. We will discontinue the phentermine and try her on saxenda.   Wt Readings from Last 3 Encounters: 03/23/21 : 180 lb 6.4 oz (81.8 kg) 01/19/21 : 191 lb 6.4 oz (86.8 kg) 12/29/20 : 198 lb 3.2 oz (89.9 kg)      No past medical history on file.   Family History  Problem Relation Age of Onset   Hypertension Mother    Cancer Father    Cancer Maternal Grandmother      Current Outpatient Medications:    Liraglutide -Weight Management (SAXENDA) 18 MG/3ML SOPN, Inject 0.6 mg into the skin daily., Disp: 3 mL, Rfl: 0   Vitamin D, Ergocalciferol, (DRISDOL) 1.25 MG (50000 UNIT) CAPS capsule, Take 1 capsule (50,000 Units total) by mouth every 7 (seven) days., Disp: 12 capsule, Rfl: 0   No Known Allergies   Review of Systems  Constitutional: Negative.  Negative for chills.  HENT:  Negative for congestion, rhinorrhea and sinus pain.   Respiratory:  Negative for cough and wheezing.   Cardiovascular:  Negative for chest pain and palpitations.  Gastrointestinal:  Negative for abdominal distention, diarrhea and nausea.  Neurological: Negative.  Negative for  headaches.  Psychiatric/Behavioral: Negative.      Today's Vitals   03/23/21 1520  BP: 128/80  Pulse: 88  SpO2: 99%  Weight: 180 lb 6.4 oz (81.8 kg)  Height: 5\' 1"  (1.549 m)  PainSc: 0-No pain   Body mass index is 34.09 kg/m.  Wt Readings from Last 3 Encounters:  03/23/21 180 lb 6.4 oz (81.8 kg)  01/19/21 191 lb 6.4 oz (86.8 kg)  12/29/20 198 lb 3.2 oz (89.9 kg)    Objective:  Physical Exam Constitutional:      Appearance: Normal appearance. She is obese.  HENT:     Head: Normocephalic and atraumatic.  Cardiovascular:     Rate and Rhythm: Normal rate and regular rhythm.     Pulses: Normal pulses.     Heart sounds: Normal heart sounds. No murmur heard. Pulmonary:     Effort: Pulmonary effort is normal. No respiratory distress.     Breath sounds: Normal breath sounds. No wheezing.  Skin:    General: Skin is warm and dry.  Neurological:     Mental Status: She is alert.        Assessment And Plan:     1. Class 2 obesity due to excess calories without serious comorbidity with body mass index (BMI) of 35.0 to 35.9 in adult -Stopped phentermine  -Pt. Verbalized consent to give teaching and administer saxenda in the office.  -SE were discussed with patient including nausea, constipation, HA, decreased appetite, upset  stomach, tiredness, dizziness.  Pt. Denied pancreatitis, thyroid gland tumor, personal or family history of multiple endocrine neoplasia syndrome type 2 (MEN2), gallstones and history of alcoholism/ high blood triglycerides.  - Liraglutide -Weight Management (SAXENDA) 18 MG/3ML SOPN; Inject 0.6 mg into the skin daily.  Dispense: 3 mL; Refill: 0   2. Needs flu shot - Flu Vaccine QUAD 6+ mos PF IM (Fluarix Quad PF)  3. Vitamin D deficiency - Vitamin D, Ergocalciferol, (DRISDOL) 1.25 MG (50000 UNIT) CAPS capsule; Take 1 capsule (50,000 Units total) by mouth every 7 (seven) days.  Dispense: 12 capsule; Refill: 0   The patient was encouraged to call or send a  message through MyChart for any questions or concerns.   Follow up: 2 months   Side effects and appropriate use of all the medication(s) were discussed with the patient today. Patient advised to use the medication(s) as directed by their healthcare provider. The patient was encouraged to read, review, and understand all associated package inserts and contact our office with any questions or concerns. The patient accepts the risks of the treatment plan and had an opportunity to ask questions.   Staying healthy and adopting a healthy lifestyle for your overall health is important. You should eat 7 or more servings of fruits and vegetables per day. You should drink plenty of water to keep yourself hydrated and your kidneys healthy. This includes about 65-80+ fluid ounces of water. Limit your intake of animal fats especially for elevated cholesterol. Avoid highly processed food and limit your salt intake if you have hypertension. Avoid foods high in saturated/Trans fats. Along with a healthy diet it is also very important to maintain time for yourself to maintain a healthy mental health with low stress levels. You should get atleast 150 min of moderate intensity exercise weekly for a healthy heart. Along with eating right and exercising, aim for at least 7-9 hours of sleep daily.  Eat more whole grains which includes barley, wheat berries, oats, brown rice and whole wheat pasta. Use healthy plant oils which include olive, soy, corn, sunflower and peanut. Limit your caffeine and sugary drinks. Limit your intake of fast foods. Limit milk and dairy products to one or two daily servings.   Patient was given opportunity to ask questions. Patient verbalized understanding of the plan and was able to repeat key elements of the plan. All questions were answered to their satisfaction.  Raman Kavir Savoca, DNP   I, Raman Benney [place] have reviewed all documentation for this visit. The documentation on 03/23/21 for the exam,  diagnosis, procedures, and orders are all accurate and complete.    IF YOU HAVE BEEN REFERRED TO A SPECIALIST, IT MAY TAKE 1-2 WEEKS TO SCHEDULE/PROCESS THE REFERRAL. IF YOU HAVE NOT HEARD FROM US/SPECIALIST IN TWO WEEKS, PLEASE GIVE [place] A CALL AT [phone].   THE PATIENT IS ENCOURAGED TO PRACTICE SOCIAL DISTANCING DUE TO THE COVID-19 PANDEMIC.

## 2021-03-29 ENCOUNTER — Other Ambulatory Visit: Payer: Self-pay | Admitting: Nurse Practitioner

## 2021-03-29 DIAGNOSIS — Z6835 Body mass index (BMI) 35.0-35.9, adult: Secondary | ICD-10-CM

## 2021-03-29 DIAGNOSIS — E6609 Other obesity due to excess calories: Secondary | ICD-10-CM

## 2021-03-29 MED ORDER — PHENTERMINE HCL 37.5 MG PO TABS: ORAL_TABLET | ORAL | 0 refills | Status: DC

## 2021-03-29 MED ORDER — SAXENDA 18 MG/3ML ~~LOC~~ SOPN: 0.6000 mg | PEN_INJECTOR | Freq: Every day | SUBCUTANEOUS | 0 refills | Status: DC

## 2021-03-30 ENCOUNTER — Telehealth: Payer: Self-pay

## 2021-03-30 ENCOUNTER — Other Ambulatory Visit: Payer: Self-pay | Admitting: Nurse Practitioner

## 2021-03-30 NOTE — Telephone Encounter (Signed)
The pt was asked what her middle initial is and the patient said she didn't have a middle name and that she is on phentermine now and doesn't need the prior auth for saxenda.

## 2021-04-13 ENCOUNTER — Ambulatory Visit: Payer: 59 | Admitting: Nurse Practitioner

## 2021-04-27 ENCOUNTER — Ambulatory Visit: Payer: 59 | Admitting: Nurse Practitioner

## 2021-04-27 ENCOUNTER — Other Ambulatory Visit: Payer: Self-pay

## 2021-04-27 ENCOUNTER — Encounter: Payer: Self-pay | Admitting: Nurse Practitioner

## 2021-04-27 VITALS — BP 130/68 | HR 80 | Temp 98.6°F | Ht 61.0 in | Wt 183.8 lb

## 2021-04-27 DIAGNOSIS — R7303 Prediabetes: Secondary | ICD-10-CM | POA: Diagnosis not present

## 2021-04-27 DIAGNOSIS — Z6835 Body mass index (BMI) 35.0-35.9, adult: Secondary | ICD-10-CM

## 2021-04-27 DIAGNOSIS — E6609 Other obesity due to excess calories: Secondary | ICD-10-CM | POA: Diagnosis not present

## 2021-04-27 MED ORDER — SAXENDA 18 MG/3ML ~~LOC~~ SOPN: 0.6000 mg | PEN_INJECTOR | Freq: Every day | SUBCUTANEOUS | 0 refills | Status: DC

## 2021-04-27 NOTE — Patient Instructions (Signed)
Obesity, Adult Obesity is having too much body fat. Being obese means that your weight is morethan what is healthy for you. BMI is a number that explains how much body fat you have. If you have a BMI of 30 or more, you are obese. Obesity is often caused by eating or drinking morecalories than your body uses. Changing your lifestyle can help you lose weight. Obesity can cause serious health problems, such as: Stroke. Coronary artery disease (CAD). Type 2 diabetes. Some types of cancer, including cancers of the colon, breast, uterus, and gallbladder. Osteoarthritis. High blood pressure (hypertension). High cholesterol. Sleep apnea. Gallbladder stones. Infertility problems. What are the causes? Eating meals each day that are high in calories, sugar, and fat. Being born with genes that may make you more likely to become obese. Having a medical condition that causes obesity. Taking certain medicines. Sitting a lot (having a sedentary lifestyle). Not getting enough sleep. Drinking a lot of drinks that have sugar in them. What increases the risk? Having a family history of obesity. Being an African American woman. Being a Hispanic man. Living in an area with limited access to: Parks, recreation centers, or sidewalks. Healthy food choices, such as grocery stores and farmers' markets. What are the signs or symptoms? The main sign is having too much body fat. How is this treated? Treatment for this condition often includes changing your lifestyle. Treatment may include: Changing your diet. This may include making a healthy meal plan. Exercise. This may include activity that causes your heart to beat faster (aerobic exercise) and strength training. Work with your doctor to design a program that works for you. Medicine to help you lose weight. This may be used if you are not able to lose 1 pound a week after 6 weeks of healthy eating and more exercise. Treating conditions that cause the  obesity. Surgery. Options may include gastric banding and gastric bypass. This may be done if: Other treatments have not helped to improve your condition. You have a BMI of 40 or higher. You have life-threatening health problems related to obesity. Follow these instructions at home: Eating and drinking  Follow advice from your doctor about what to eat and drink. Your doctor may tell you to: Limit fast food, sweets, and processed snack foods. Choose low-fat options. For example, choose low-fat milk instead of whole milk. Eat 5 or more servings of fruits or vegetables each day. Eat at home more often. This gives you more control over what you eat. Choose healthy foods when you eat out. Learn to read food labels. This will help you learn how much food is in 1 serving. Keep low-fat snacks available. Avoid drinks that have a lot of sugar in them. These include soda, fruit juice, iced tea with sugar, and flavored milk. Drink enough water to keep your pee (urine) pale yellow. Do not go on fad diets.  Physical activity Exercise often, as told by your doctor. Most adults should get up to 150 minutes of moderate-intensity exercise every week.Ask your doctor: What types of exercise are safe for you. How often you should exercise. Warm up and stretch before being active. Do slow stretching after being active (cool down). Rest between times of being active. Lifestyle Work with your doctor and a food expert (dietitian) to set a weight-loss goal that is best for you. Limit your screen time. Find ways to reward yourself that do not involve food. Do not drink alcohol if: Your doctor tells you not to drink.   You are pregnant, may be pregnant, or are planning to become pregnant. If you drink alcohol: Limit how much you use to: 0-1 drink a day for women. 0-2 drinks a day for men. Be aware of how much alcohol is in your drink. In the [place] one drink equals one 12 oz bottle of beer (355 mL), one 5 oz  glass of wine (148 mL), or one 1 oz glass of hard liquor (44 mL). General instructions Keep a weight-loss journal. This can help you keep track of: The food that you eat. How much exercise you get. Take over-the-counter and prescription medicines only as told by your doctor. Take vitamins and supplements only as told by your doctor. Think about joining a support group. Keep all follow-up visits as told by your doctor. This is important. Contact a doctor if: You cannot meet your weight loss goal after you have changed your diet and lifestyle for 6 weeks. Get help right away if you: Are having trouble breathing. Are having thoughts of harming yourself. Summary Obesity is having too much body fat. Being obese means that your weight is more than what is healthy for you. Work with your doctor to set a weight-loss goal. Get regular exercise as told by your doctor. This information is not intended to replace advice given to you by your health care provider. Make sure you discuss any questions you have with your healthcare provider. Document Revised: 03/07/2018 Document Reviewed: 03/07/2018 Elsevier Patient Education  2022 Elsevier Inc.  

## 2021-04-27 NOTE — Progress Notes (Signed)
I,Tianna Badgett,acting as a Neurosurgeon for [employer], NP.,have documented all relevant documentation on the behalf of [employer], NP,as directed by  Charlesetta Ivory, NP while in the presence of Charlesetta Ivory, NP.  This visit occurred during the SARS-CoV-2 public health emergency.  Safety protocols were in place, including screening questions prior to the visit, additional usage of staff PPE, and extensive cleaning of exam room while observing appropriate contact time as indicated for disinfecting solutions.  Subjective:     Patient ID: Kathleen Quinn , female    DOB: March 30, 1986 , 35 y.o.   MRN: [id number]   Chief Complaint  Patient presents with   Weight Check    HPI  Pt presents today for a weight check. She was taking phentermine. But she wants to try saxenda again. She is still in school and working on her diet and exercises.   Wt Readings from Last 3 Encounters: 04/27/21 : 183 lb 12.8 oz (83.4 kg) 03/23/21 : 180 lb 6.4 oz (81.8 kg) 01/19/21 : 191 lb 6.4 oz (86.8 kg)      No past medical history on file.   Family History  Problem Relation Age of Onset   Hypertension Mother    Cancer Father    Cancer Maternal Grandmother      Current Outpatient Medications:    Liraglutide -Weight Management (SAXENDA) 18 MG/3ML SOPN, Inject 0.6 mg into the skin daily., Disp: 3 mL, Rfl: 0   Vitamin D, Ergocalciferol, (DRISDOL) 1.25 MG (50000 UNIT) CAPS capsule, Take 1 capsule (50,000 Units total) by mouth every 7 (seven) days., Disp: 12 capsule, Rfl: 0   No Known Allergies   Review of Systems  Constitutional: Negative.  Negative for chills, fever and unexpected weight change.  HENT:  Negative for congestion, rhinorrhea and sinus pressure.   Respiratory: Negative.  Negative for shortness of breath and wheezing.   Cardiovascular: Negative.  Negative for chest pain and palpitations.  Gastrointestinal: Negative.  Negative for abdominal distention, constipation and  diarrhea.  Musculoskeletal:  Negative for arthralgias and myalgias.  Neurological: Negative.  Negative for weakness.    Today's Vitals   04/27/21 1537  BP: 130/68  Pulse: 80  Temp: 98.6 F (37 C)  TempSrc: Oral  Weight: 183 lb 12.8 oz (83.4 kg)  Height: 5\' 1"  (1.549 m)   Body mass index is 34.73 kg/m.  Wt Readings from Last 3 Encounters:  04/27/21 183 lb 12.8 oz (83.4 kg)  03/23/21 180 lb 6.4 oz (81.8 kg)  01/19/21 191 lb 6.4 oz (86.8 kg)     Objective:  Physical Exam Constitutional:      Appearance: Normal appearance. She is obese.  HENT:     Head: Normocephalic and atraumatic.  Cardiovascular:     Rate and Rhythm: Normal rate and regular rhythm.     Pulses: Normal pulses.     Heart sounds: Normal heart sounds. No murmur heard. Pulmonary:     Effort: Pulmonary effort is normal. No respiratory distress.     Breath sounds: Normal breath sounds.  Skin:    General: Skin is warm and dry.     Capillary Refill: Capillary refill takes less than 2 seconds.  Neurological:     General: No focal deficit present.     Mental Status: She is alert and oriented to person, place, and time.        Assessment And Plan:     1. Class 2 obesity due to excess calories without serious comorbidity with body mass index (  BMI) of 35.0 to 35.9 in adult - Liraglutide -Weight Management (SAXENDA) 18 MG/3ML SOPN; Inject 0.6 mg into the skin daily.  Dispense: 3 mL; Refill: 0 -She has taken saxenda in the past and would like to try again. Sample was given to her. She denies any pancreatitis and any thyroid cancer in the family. She took it before without any side-effects. No other concerns.  Instructions given to her and advised her of SE  2. Prediabetes  - Liraglutide -Weight Management (SAXENDA) 18 MG/3ML SOPN; Inject 0.6 mg into the skin daily.  Dispense: 3 mL; Refill: 0  The patient was encouraged to call or send a message through MyChart for any questions or concerns.   Follow up: 2  months   Side effects and appropriate use of all the medication(s) were discussed with the patient today. Patient advised to use the medication(s) as directed by their healthcare provider. The patient was encouraged to read, review, and understand all associated package inserts and contact our office with any questions or concerns. The patient accepts the risks of the treatment plan and had an opportunity to ask questions.   Patient was given opportunity to ask questions. Patient verbalized understanding of the plan and was able to repeat key elements of the plan. All questions were answered to their satisfaction.  Raman Daphene Chisholm, DNP   I, Raman Marlaina Coburn have reviewed all documentation for this visit. The documentation on 04/27/21 for the exam, diagnosis, procedures, and orders are all accurate and complete.    IF YOU HAVE BEEN REFERRED TO A SPECIALIST, IT MAY TAKE 1-2 WEEKS TO SCHEDULE/PROCESS THE REFERRAL. IF YOU HAVE NOT HEARD FROM US/SPECIALIST IN TWO WEEKS, PLEASE GIVE [place] A CALL AT [phone].   THE PATIENT IS ENCOURAGED TO PRACTICE SOCIAL DISTANCING DUE TO THE COVID-19 PANDEMIC.

## 2021-06-15 ENCOUNTER — Other Ambulatory Visit: Payer: Self-pay

## 2021-06-15 DIAGNOSIS — E559 Vitamin D deficiency, unspecified: Secondary | ICD-10-CM

## 2021-06-15 MED ORDER — VITAMIN D (ERGOCALCIFEROL) 1.25 MG (50000 UNIT) PO CAPS
50000.0000 [IU] | ORAL_CAPSULE | ORAL | 0 refills | Status: DC
Start: 2021-06-15 — End: 2021-09-13

## 2021-06-28 ENCOUNTER — Ambulatory Visit: Payer: 59 | Admitting: Nurse Practitioner

## 2021-06-28 ENCOUNTER — Other Ambulatory Visit: Payer: Self-pay

## 2021-06-28 ENCOUNTER — Encounter: Payer: Self-pay | Admitting: Nurse Practitioner

## 2021-06-28 VITALS — BP 130/78 | HR 94 | Temp 99.1°F | Ht 62.0 in | Wt 177.4 lb

## 2021-06-28 DIAGNOSIS — E6609 Other obesity due to excess calories: Secondary | ICD-10-CM | POA: Diagnosis not present

## 2021-06-28 DIAGNOSIS — R7303 Prediabetes: Secondary | ICD-10-CM | POA: Diagnosis not present

## 2021-06-28 DIAGNOSIS — Z6832 Body mass index (BMI) 32.0-32.9, adult: Secondary | ICD-10-CM | POA: Diagnosis not present

## 2021-06-28 MED ORDER — SAXENDA 18 MG/3ML ~~LOC~~ SOPN: 0.6000 mg | PEN_INJECTOR | Freq: Every day | SUBCUTANEOUS | 0 refills | Status: DC

## 2021-06-28 NOTE — Patient Instructions (Signed)
Obesity, Adult °Obesity is having too much body fat. Being obese means that your weight is more than what is healthy for you.  °BMI (body mass index) is a number that explains how much body fat you have. If you have a BMI of 30 or more, you are obese. °Obesity can cause serious health problems, such as: °Stroke. °Coronary artery disease (CAD). °Type 2 diabetes. °Some types of cancer. °High blood pressure (hypertension). °High cholesterol. °Gallbladder stones. °Obesity can also contribute to: °Osteoarthritis. °Sleep apnea. °Infertility problems. °What are the causes? °Eating meals each day that are high in calories, sugar, and fat. °Drinking a lot of drinks that have sugar in them. °Being born with genes that may make you more likely to become obese. °Having a medical condition that causes obesity. °Taking certain medicines. °Sitting a lot (having a sedentary lifestyle). °Not getting enough sleep. °What increases the risk? °Having a family history of obesity. °Living in an area with limited access to: °Parks, recreation centers, or sidewalks. °Healthy food choices, such as grocery stores and farmers' markets. °What are the signs or symptoms? °The main sign is having too much body fat. °How is this treated? °Treatment for this condition often includes changing your lifestyle. Treatment may include: °Changing your diet. This may include making a healthy meal plan. °Exercise. This may include activity that causes your heart to beat faster (aerobic exercise) and strength training. Work with your doctor to design a program that works for you. °Medicine to help you lose weight. This may be used if you are not able to lose one pound a week after 6 weeks of healthy eating and more exercise. °Treating conditions that cause the obesity. °Surgery. Options may include gastric banding and gastric bypass. This may be done if: °Other treatments have not helped to improve your condition. °You have a BMI of 40 or higher. °You have  life-threatening health problems related to obesity. °Follow these instructions at home: °Eating and drinking ° °Follow advice from your doctor about what to eat and drink. Your doctor may tell you to: °Limit fast food, sweets, and processed snack foods. °Choose low-fat options. For example, choose low-fat milk instead of whole milk. °Eat five or more servings of fruits or vegetables each day. °Eat at home more often. This gives you more control over what you eat. °Choose healthy foods when you eat out. °Learn to read food labels. This will help you learn how much food is in one serving. °Keep low-fat snacks available. °Avoid drinks that have a lot of sugar in them. These include soda, fruit juice, iced tea with sugar, and flavored milk. °Drink enough water to keep your pee (urine) pale yellow. °Do not go on fad diets. °Physical activity °Exercise often, as told by your doctor. Most adults should get up to 150 minutes of moderate-intensity exercise every week.Ask your doctor: °What types of exercise are safe for you. °How often you should exercise. °Warm up and stretch before being active. °Do slow stretching after being active (cool down). °Rest between times of being active. °Lifestyle °Work with your doctor and a food expert (dietitian) to set a weight-loss goal that is best for you. °Limit your screen time. °Find ways to reward yourself that do not involve food. °Do not drink alcohol if: °Your doctor tells you not to drink. °You are pregnant, may be pregnant, or are planning to become pregnant. °If you drink alcohol: °Limit how much you have to: °0-1 drink a day for women. °0-2 drinks   a day for men. °Know how much alcohol is in your drink. In the [place] one drink equals one 12 oz bottle of beer (355 mL), one 5 oz glass of wine (148 mL), or one 1½ oz glass of hard liquor (44 mL). °General instructions °Keep a weight-loss journal. This can help you keep track of: °The food that you eat. °How much exercise you  get. °Take over-the-counter and prescription medicines only as told by your doctor. °Take vitamins and supplements only as told by your doctor. °Think about joining a support group. °Pay attention to your mental health as obesity can lead to depression or self esteem issues. °Keep all follow-up visits. °Contact a doctor if: °You cannot meet your weight-loss goal after you have changed your diet and lifestyle for 6 weeks. °You are having trouble breathing. °Summary °Obesity is having too much body fat. °Being obese means that your weight is more than what is healthy for you. °Work with your doctor to set a weight-loss goal. °Get regular exercise as told by your doctor. °This information is not intended to replace advice given to you by your health care provider. Make sure you discuss any questions you have with your health care provider. °Document Revised: 02/08/2021 Document Reviewed: 02/08/2021 °Elsevier Patient Education © 2022 [employer] ° °

## 2021-06-28 NOTE — Progress Notes (Signed)
I,Tianna Badgett,acting as a Education administrator for [employer], NP.,have documented all relevant documentation on the behalf of [employer], NP,as directed by  Bary Castilla, NP while in the presence of Bary Castilla, NP.  This visit occurred during the SARS-CoV-2 public health emergency.  Safety protocols were in place, including screening questions prior to the visit, additional usage of staff PPE, and extensive cleaning of exam room while observing appropriate contact time as indicated for disinfecting solutions.  Subjective:     Patient ID: Kathleen Quinn , female    DOB: 02-12-1986 , 35 y.o.   MRN: [id number]   Chief Complaint  Patient presents with   Weight Loss    HPI  Pt presents today for a weight check.  She has been eating better.  She is in [place] at [employer] [facility] [employer]. 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 Her cousin just passed away. She is doing really good with the saxenda. She would like to continue on it.   Wt Readings from Last 3 Encounters: 06/28/21 : 177 lb 6.4 oz (80.5 kg) 04/27/21 : 183 lb 12.8 oz (83.4 kg) 03/23/21 : 180 lb 6.4 oz (81.8 kg)      No past medical history on file.   Family History  Problem Relation Age of Onset   Hypertension Mother    Cancer Father    Cancer Maternal Grandmother      Current Outpatient Medications:    Liraglutide -Weight Management (SAXENDA) 18 MG/3ML SOPN, Inject 0.6 mg into the skin daily., Disp: 3 mL, Rfl: 0   Vitamin D, Ergocalciferol, (DRISDOL) 1.25 MG (50000 UNIT) CAPS capsule, Take 1 capsule (50,000 Units total) by mouth every 7 (seven) days., Disp: 12 capsule, Rfl: 0   No Known Allergies   Review of Systems  Constitutional: Negative.  Negative for chills and fever.  HENT:  Negative for congestion.   Respiratory: Negative.  Negative for shortness of breath and wheezing.   Cardiovascular: Negative.  Negative for chest pain and palpitations.  Gastrointestinal: Negative.  Negative for  constipation, diarrhea and vomiting.  Musculoskeletal:  Negative for arthralgias and myalgias.  Neurological: Negative.  Negative for dizziness, weakness and headaches.    Today's Vitals   06/28/21 1422  BP: 130/78  Pulse: 94  Temp: 99.1 F (37.3 C)  TempSrc: Oral  Weight: 177 lb 6.4 oz (80.5 kg)  Height: _0  (1.575 m)   Body mass index is 32.45 kg/m.  Wt Readings from Last 3 Encounters:  06/28/21 177 lb 6.4 oz (80.5 kg)  04/27/21 183 lb 12.8 oz (83.4 kg)  03/23/21 180 lb 6.4 oz (81.8 kg)    Objective:  Physical Exam Constitutional:      Appearance: Normal appearance. She is obese.  HENT:     Head: Normocephalic and atraumatic.  Cardiovascular:     Rate and Rhythm: Normal rate and regular rhythm.     Pulses: Normal pulses.     Heart sounds: Normal heart sounds. No murmur heard. Pulmonary:     Effort: Pulmonary effort is normal. No respiratory distress.     Breath sounds: Normal breath sounds. No wheezing.  Skin:    General: Skin is warm and dry.     Capillary Refill: Capillary refill takes less than 2 seconds.  Neurological:     Mental Status: She is alert and oriented to person, place, and time.        Assessment And Plan:     1. Class 1 obesity due to excess  calories without serious comorbidity with body mass index (BMI) of 32.0 to 32.9 in adult - Liraglutide -Weight Management (SAXENDA) 18 MG/3ML SOPN; Inject 0.6 mg into the skin daily.  Dispense: 3 mL; Refill: 0 -Continue saxenda  -Pt. Does not have any SE is tolerating saxenda  -Advised patient about diet and exercise modification .   2. Prediabetes - Liraglutide -Weight Management (SAXENDA) 18 MG/3ML SOPN; Inject 0.6 mg into the skin daily.  Dispense: 3 mL; Refill: 0 - Hemoglobin A1c - CMP14+EGFR  -Will check her Hgb A1c today and assess.   Side effects and appropriate use of all the medication(s) were discussed with the patient today. Patient advised to use the medication(s) as directed by their  healthcare provider. The patient was encouraged to read, review, and understand all associated package inserts and contact our office with any questions or concerns. The patient accepts the risks of the treatment plan and had an opportunity to ask questions.   Follow up: if symptoms persist or do not get better.   The patient was encouraged to call or send a message through [place] for any questions or concerns.   Staying healthy and adopting a healthy lifestyle for your overall health is important. You should eat 7 or more servings of fruits and vegetables per day. You should drink plenty of water to keep yourself hydrated and your kidneys healthy. This includes about 65-80+ fluid ounces of water. Limit your intake of animal fats especially for elevated cholesterol. Avoid highly processed food and limit your salt intake if you have hypertension. Avoid foods high in saturated/Trans fats. Along with a healthy diet it is also very important to maintain time for yourself to maintain a healthy mental health with low stress levels. You should get atleast 150 min of moderate intensity exercise weekly for a healthy heart. Along with eating right and exercising, aim for at least 7-9 hours of sleep daily.  Eat more whole grains which includes barley, wheat berries, oats, brown rice and whole wheat pasta. Use healthy plant oils which include olive, soy, corn, sunflower and peanut. Limit your caffeine and sugary drinks. Limit your intake of fast foods. Limit milk and dairy products to one or two daily servings.   Patient was given opportunity to ask questions. Patient verbalized understanding of the plan and was able to repeat key elements of the plan. All questions were answered to their satisfaction.  Kathleen Auset Fritzler, DNP   I, Kathleen Quinn have reviewed all documentation for this visit. The documentation on 11/25/20 for the exam, diagnosis, procedures, and orders are all accurate and complete.    IF YOU HAVE  BEEN REFERRED TO A SPECIALIST, IT MAY TAKE 1-2 WEEKS TO SCHEDULE/PROCESS THE REFERRAL. IF YOU HAVE NOT HEARD FROM US/SPECIALIST IN TWO WEEKS, PLEASE GIVE [place] A CALL AT [phone].   THE PATIENT IS ENCOURAGED TO PRACTICE SOCIAL DISTANCING DUE TO THE COVID-19 PANDEMIC.

## 2021-06-29 LAB — CMP14+EGFR
ALT: 17 IU/L (ref 0–32)
AST: 21 IU/L (ref 0–40)
Albumin/Globulin Ratio: 2 (ref 1.2–2.2)
Albumin: 4.7 g/dL (ref 3.8–4.8)
Alkaline Phosphatase: 59 IU/L (ref 44–121)
BUN/Creatinine Ratio: 13 (ref 9–23)
BUN: 11 mg/dL (ref 6–20)
Bilirubin Total: 0.2 mg/dL (ref 0.0–1.2)
CO2: 23 mmol/L (ref 20–29)
Calcium: 9.4 mg/dL (ref 8.7–10.2)
Chloride: 103 mmol/L (ref 96–106)
Creatinine, Ser: 0.88 mg/dL (ref 0.57–1.00)
Globulin, Total: 2.4 g/dL (ref 1.5–4.5)
Glucose: 79 mg/dL (ref 70–99)
Potassium: 4.4 mmol/L (ref 3.5–5.2)
Sodium: 139 mmol/L (ref 134–144)
Total Protein: 7.1 g/dL (ref 6.0–8.5)
eGFR: 87 mL/min/{1.73_m2} (ref >59)

## 2021-06-29 LAB — HEMOGLOBIN A1C
Est. average glucose Bld gHb Est-mCnc: 120 mg/dL
Hgb A1c MFr Bld: 5.8 % — ABNORMAL HIGH (ref 4.8–5.6)

## 2021-08-31 ENCOUNTER — Ambulatory Visit: Payer: 59 | Admitting: Nurse Practitioner

## 2021-09-13 ENCOUNTER — Encounter: Payer: Self-pay | Admitting: Internal Medicine

## 2021-09-13 ENCOUNTER — Ambulatory Visit (INDEPENDENT_AMBULATORY_CARE_PROVIDER_SITE_OTHER): Payer: No Typology Code available for payment source | Admitting: Internal Medicine

## 2021-09-13 ENCOUNTER — Other Ambulatory Visit: Payer: Self-pay

## 2021-09-13 VITALS — BP 122/80 | HR 87 | Temp 99.0°F | Ht 60.8 in | Wt 172.4 lb

## 2021-09-13 DIAGNOSIS — R7309 Other abnormal glucose: Secondary | ICD-10-CM

## 2021-09-13 DIAGNOSIS — E559 Vitamin D deficiency, unspecified: Secondary | ICD-10-CM

## 2021-09-13 DIAGNOSIS — Z6832 Body mass index (BMI) 32.0-32.9, adult: Secondary | ICD-10-CM

## 2021-09-13 DIAGNOSIS — E6609 Other obesity due to excess calories: Secondary | ICD-10-CM

## 2021-09-13 NOTE — Patient Instructions (Addendum)
KCMKLK 0.25mg  weekly x 2 weeks, then 0.5mg  weekly  Vitamin D3 5000 units once daily (in capsule form) Exercising to Lose Weight Getting regular exercise is important for everyone. It is especially important if you are overweight. Being overweight increases your risk of heart disease, stroke, diabetes, high blood pressure, and several types of cancer. Exercising, and reducing the calories you consume, can help you lose weight and improve fitness and health. Exercise can be moderate or vigorous intensity. To lose weight, most people need to do a certain amount of moderate or vigorous-intensity exercise each week. How can exercise affect me? You lose weight when you exercise enough to burn more calories than you eat. Exercise also reduces body fat and builds muscle. The more muscle you have, the more calories you burn. Exercise also: Improves mood. Reduces stress and tension. Improves your overall fitness, flexibility, and endurance. Increases bone strength. Moderate-intensity exercise Moderate-intensity exercise is any activity that gets you moving enough to burn at least three times more energy (calories) than if you were sitting. Examples of moderate exercise include: Walking a mile in 15 minutes. Doing light yard work. Biking at an easy pace. Most people should get at least 150 minutes of moderate-intensity exercise a week to maintain their body weight. Vigorous-intensity exercise Vigorous-intensity exercise is any activity that gets you moving enough to burn at least six times more calories than if you were sitting. When you exercise at this intensity, you should be working hard enough that you are not able to carry on a conversation. Examples of vigorous exercise include: Running. Playing a team sport, such as football, basketball, and soccer. Jumping rope. Most people should get at least 75 minutes a week of vigorous exercise to maintain their body weight. What actions can I take to  lose weight? The amount of exercise you need to lose weight depends on: Your age. The type of exercise. Any health conditions you have. Your overall physical ability. Talk to your health care provider about how much exercise you need and what types of activities are safe for you. Nutrition  Make changes to your diet as told by your health care provider or diet and nutrition specialist (dietitian). This may include: Eating fewer calories. Eating more protein. Eating less unhealthy fats. Eating a diet that includes fresh fruits and vegetables, whole grains, low-fat dairy products, and lean protein. Avoiding foods with added fat, salt, and sugar. Drink plenty of water while you exercise to prevent dehydration or heat stroke. Activity Choose an activity that you enjoy and set realistic goals. Your health care provider can help you make an exercise plan that works for you. Exercise at a moderate or vigorous intensity most days of the week. The intensity of exercise may vary from person to person. You can tell how intense a workout is for you by paying attention to your breathing and heartbeat. Most people will notice their breathing and heartbeat get faster with more intense exercise. Do resistance training twice each week, such as: Push-ups. Sit-ups. Lifting weights. Using resistance bands. Getting short amounts of exercise can be just as helpful as long, structured periods of exercise. If you have trouble finding time to exercise, try doing these things as part of your daily routine: Get up, stretch, and walk around every 30 minutes throughout the day. Go for a walk during your lunch break. Park your car farther away from your destination. If you take public transportation, get off one stop early and walk the rest of the  way. Make phone calls while standing up and walking around. Take the stairs instead of elevators or escalators. Wear comfortable clothes and shoes with good support. Do  not exercise so much that you hurt yourself, feel dizzy, or get very short of breath. Where to find more information [place] Department of Health and Human Services: ThisPath.fi Centers for Disease Control and Prevention: FootballExhibition.com.br Contact a health care provider: Before starting a new exercise program. If you have questions or concerns about your weight. If you have a medical problem that keeps you from exercising. Get help right away if: You have any of the following while exercising: Injury. Dizziness. Difficulty breathing or shortness of breath that does not go away when you stop exercising. Chest pain. Rapid heartbeat. These symptoms may represent a serious problem that is an emergency. Do not wait to see if the symptoms will go away. Get medical help right away. Call your local emergency services (911 in the [place] Do not drive yourself to the hospital. Summary Getting regular exercise is especially important if you are overweight. Being overweight increases your risk of heart disease, stroke, diabetes, high blood pressure, and several types of cancer. Losing weight happens when you burn more calories than you eat. Reducing the amount of calories you eat, and getting regular moderate or vigorous exercise each week, helps you lose weight. This information is not intended to replace advice given to you by your health care provider. Make sure you discuss any questions you have with your health care provider. Document Revised: 08/29/2020 Document Reviewed: 08/29/2020 Elsevier Patient Education  2022 [employer].

## 2021-09-13 NOTE — Progress Notes (Signed)
Jeri Cos [place]ittleton,acting as a Neurosurgeon for Gwynneth Aliment, MD.,have documented all relevant documentation on the behalf of Gwynneth Aliment, MD,as directed by  Gwynneth Aliment, MD while in the presence of Gwynneth Aliment, MD.  This visit occurred during the SARS-CoV-2 public health emergency.  Safety protocols were in place, including screening questions prior to the visit, additional usage of staff PPE, and extensive cleaning of exam room while observing appropriate contact time as indicated for disinfecting solutions.  Subjective:     Patient ID: Kathleen Quinn , female    DOB: [birth year] , 36 y.o.   MRN: [id number]   Chief Complaint  Patient presents with   Weight Check    HPI  Patient presents today for a weight check. She was taking Saxenda but has not been able to get it from the pharmacy, so she has been without it for a couple weeks. She has no other concerns at this time.     Past Medical History:  Diagnosis Date   Obesity      Family History  Problem Relation Age of Onset   Hypertension Mother    Cancer Father    Cancer Maternal Grandmother      Current Outpatient Medications:    Liraglutide -Weight Management (SAXENDA) 18 MG/3ML SOPN, Inject 0.6 mg into the skin daily. (Patient not taking: Reported on 09/13/2021), Disp: 3 mL, Rfl: 0   No Known Allergies   Review of Systems  Constitutional: Negative.   Respiratory: Negative.    Cardiovascular: Negative.   Gastrointestinal: Negative.   Neurological: Negative.   Psychiatric/Behavioral: Negative.      Today's Vitals   09/13/21 1527  BP: 122/80  Pulse: 87  Temp: 99 F (37.2 C)  Weight: 172 lb 6.4 oz (78.2 kg)  Height: 5' 0.8" (1.544 m)  PainSc: 0-No pain   Body mass index is 32.79 kg/m.  Wt Readings from Last 3 Encounters:  09/13/21 172 lb 6.4 oz (78.2 kg)  06/28/21 177 lb 6.4 oz (80.5 kg)  04/27/21 183 lb 12.8 oz (83.4 kg)    Objective:  Physical Exam Vitals and nursing note reviewed.   Constitutional:      Appearance: Normal appearance. She is obese.  HENT:     Head: Normocephalic and atraumatic.     Nose:     Comments: Masked     Mouth/Throat:     Comments: Masked  Eyes:     Extraocular Movements: Extraocular movements intact.  Cardiovascular:     Rate and Rhythm: Normal rate and regular rhythm.     Heart sounds: Normal heart sounds.  Pulmonary:     Effort: Pulmonary effort is normal.     Breath sounds: Normal breath sounds.  Musculoskeletal:     Cervical back: Normal range of motion.  Skin:    General: Skin is warm.  Neurological:     General: No focal deficit present.     Mental Status: She is alert.  Psychiatric:        Mood and Affect: Mood normal.        Behavior: Behavior normal.        Assessment And Plan:     1. Class 1 obesity due to excess calories without serious comorbidity with body mass index (BMI) of 32.0 to 32.9 in adult Comments: She was congratulated on her 5 lb weight loss since Dec 2022. She has lost 11 lbs since Oct 2022. I will switch her to [facility] once weekly. She will start  with 0.25mg  weekly x 2, then 0.5mg  weekly. She is reminded to stop eating when full. She denies personal/family h/o thyroid cancer. She will f/u in 8-10 weeks for re-evaluation.   2. Other abnormal glucose Comments: Her a1c has been elevated in the past. I will recheck this at her next visit. Encouraged to limit her intake of sweetened beverages, including diet.  3. Vitamin D deficiency Comments: She is encouraged to take vitamin D3 5000 unit capsules once daily.    Patient was given opportunity to ask questions. Patient verbalized understanding of the plan and was able to repeat key elements of the plan. All questions were answered to their satisfaction.   I, Gwynneth Aliment, MD, have reviewed all documentation for this visit. The documentation on 09/13/21 for the exam, diagnosis, procedures, and orders are all accurate and complete.   IF YOU HAVE BEEN  REFERRED TO A SPECIALIST, IT MAY TAKE 1-2 WEEKS TO SCHEDULE/PROCESS THE REFERRAL. IF YOU HAVE NOT HEARD FROM US/SPECIALIST IN TWO WEEKS, PLEASE GIVE [place] A CALL AT [phone].   THE PATIENT IS ENCOURAGED TO PRACTICE SOCIAL DISTANCING DUE TO THE COVID-19 PANDEMIC.

## 2021-09-15 ENCOUNTER — Encounter: Payer: Self-pay | Admitting: Internal Medicine

## 2021-10-18 ENCOUNTER — Other Ambulatory Visit: Payer: Self-pay

## 2021-10-18 MED ORDER — WEGOVY 0.5 MG/0.5ML ~~LOC~~ SOAJ: 0.5000 mg | SUBCUTANEOUS | 0 refills | Status: DC

## 2021-11-15 ENCOUNTER — Ambulatory Visit: Payer: No Typology Code available for payment source | Admitting: Internal Medicine

## 2021-12-30 ENCOUNTER — Other Ambulatory Visit: Payer: Self-pay | Admitting: Internal Medicine

## 2022-01-02 ENCOUNTER — Encounter: Payer: Self-pay | Admitting: Internal Medicine

## 2022-01-02 MED ORDER — WEGOVY 0.5 MG/0.5ML ~~LOC~~ SOAJ: 0.5000 mg | SUBCUTANEOUS | 0 refills | Status: DC

## 2022-01-05 ENCOUNTER — Encounter: Payer: 59 | Admitting: Nurse Practitioner

## 2022-02-02 ENCOUNTER — Encounter: Payer: Self-pay | Admitting: Internal Medicine

## 2022-02-02 ENCOUNTER — Ambulatory Visit (INDEPENDENT_AMBULATORY_CARE_PROVIDER_SITE_OTHER): Payer: No Typology Code available for payment source | Admitting: Internal Medicine

## 2022-02-02 VITALS — BP 138/68 | HR 90 | Temp 98.9°F | Ht 60.0 in | Wt 171.4 lb

## 2022-02-02 DIAGNOSIS — R030 Elevated blood-pressure reading, without diagnosis of hypertension: Secondary | ICD-10-CM

## 2022-02-02 DIAGNOSIS — E559 Vitamin D deficiency, unspecified: Secondary | ICD-10-CM

## 2022-02-02 DIAGNOSIS — R7303 Prediabetes: Secondary | ICD-10-CM | POA: Diagnosis not present

## 2022-02-02 DIAGNOSIS — E6609 Other obesity due to excess calories: Secondary | ICD-10-CM

## 2022-02-02 DIAGNOSIS — Z6833 Body mass index (BMI) 33.0-33.9, adult: Secondary | ICD-10-CM

## 2022-02-02 MED ORDER — WEGOVY 1 MG/0.5ML ~~LOC~~ SOAJ: 1.0000 mg | SUBCUTANEOUS | 1 refills | Status: DC

## 2022-02-02 NOTE — Progress Notes (Signed)
Kathleen Quinn,acting as a Education administrator for Kathleen Greenland, MD.,have documented all relevant documentation on the behalf of Kathleen Greenland, MD,as directed by  Kathleen Greenland, MD while in the presence of Kathleen Greenland, MD.  Subjective:     Patient ID: Kathleen Quinn , female    DOB: 10-Sep-1985 , 36 y.o.   MRN: [id number]   Chief Complaint  Patient presents with   Weight Check    HPI  Patient presents today for a prediabetes/weight check. She has tolerated Wegovy without any issues. She is now exercising on a regular basis. She denies headaches, chest pain and shortness of breath.        Past Medical History:  Diagnosis Date   Obesity      Family History  Problem Relation Age of Onset   Hypertension Mother    Cancer Father    Cancer Maternal Grandmother      Current Outpatient Medications:    Semaglutide-Weight Management (WEGOVY) 1 MG/0.5ML SOAJ, Inject 1 mg into the skin once a week., Disp: 2 mL, Rfl: 1   No Known Allergies   Review of Systems  Constitutional: Negative.   HENT: Negative.    Eyes: Negative.   Respiratory: Negative.    Cardiovascular: Negative.   Gastrointestinal: Negative.   Psychiatric/Behavioral: Negative.       Today's Vitals   02/02/22 0830  BP: 138/68  Pulse: 90  Temp: 98.9 F (37.2 C)  TempSrc: Oral  Weight: 171 lb 6.4 oz (77.7 kg)  Height: 5' (1.524 m)  PainSc: 0-No pain   Body mass index is 33.47 kg/m.  Wt Readings from Last 3 Encounters:  02/02/22 171 lb 6.4 oz (77.7 kg)  09/13/21 172 lb 6.4 oz (78.2 kg)  06/28/21 177 lb 6.4 oz (80.5 kg)    BP Readings from Last 3 Encounters:  02/02/22 138/68  09/13/21 122/80  06/28/21 130/78     Objective:  Physical Exam Vitals and nursing note reviewed.  Constitutional:      Appearance: Normal appearance.  HENT:     Head: Normocephalic and atraumatic.  Cardiovascular:     Rate and Rhythm: Normal rate and regular rhythm.     Heart sounds: Normal heart sounds.  Pulmonary:      Effort: Pulmonary effort is normal.     Breath sounds: Normal breath sounds.  Musculoskeletal:     Cervical back: Normal range of motion.  Skin:    General: Skin is warm.  Neurological:     General: No focal deficit present.     Mental Status: She is alert.  Psychiatric:        Mood and Affect: Mood normal.        Behavior: Behavior normal.         Assessment And Plan:     1. Prediabetes Comments: Her a1c has been elevated in the past. I will recheck this today, encouraged to limit her intake of sweetened beverages, including diet drinks.  - Hemoglobin A1c - CMP14+EGFR  2. Class 1 obesity due to excess calories without serious comorbidity with body mass index (BMI) of 33.0 to 33.9 in adult Comments: She is encouraged to strive for BMI<30 to decrease cardiac risk. I will send Wegovy 52m to her pharmacy, f/u in 8 weeks for CPE.   3. Vitamin D deficiency Comments: I will check vitamin D level and supplement as needed.  - Vitamin D (25 hydroxy)  4. Elevated blood pressure reading Comments: We discussed the DASH diet  and cutting back on her salt intake. We will re-evaluate at her next visit in 8 weeks.    Patient was given opportunity to ask questions. Patient verbalized understanding of the plan and was able to repeat key elements of the plan. All questions were answered to their satisfaction.   I, Kathleen Greenland, MD, have reviewed all documentation for this visit. The documentation on 02/02/22 for the exam, diagnosis, procedures, and orders are all accurate and complete.   IF YOU HAVE BEEN REFERRED TO A SPECIALIST, IT MAY TAKE 1-2 WEEKS TO SCHEDULE/PROCESS THE REFERRAL. IF YOU HAVE NOT HEARD FROM US/SPECIALIST IN TWO WEEKS, PLEASE GIVE [place] A CALL AT [phone] X [phone].   THE PATIENT IS ENCOURAGED TO PRACTICE SOCIAL DISTANCING DUE TO THE COVID-19 PANDEMIC.

## 2022-02-02 NOTE — Patient Instructions (Addendum)

## 2022-02-03 LAB — VITAMIN D 25 HYDROXY (VIT D DEFICIENCY, FRACTURES): Vit D, 25-Hydroxy: 84.2 ng/mL (ref 30.0–100.0)

## 2022-02-03 LAB — CMP14+EGFR
ALT: 15 IU/L (ref 0–32)
AST: 16 IU/L (ref 0–40)
Albumin/Globulin Ratio: 1.9 (ref 1.2–2.2)
Albumin: 4.6 g/dL (ref 3.9–4.9)
Alkaline Phosphatase: 49 IU/L (ref 44–121)
BUN/Creatinine Ratio: 14 (ref 9–23)
BUN: 14 mg/dL (ref 6–20)
Bilirubin Total: 0.2 mg/dL (ref 0.0–1.2)
CO2: 23 mmol/L (ref 20–29)
Calcium: 9.3 mg/dL (ref 8.7–10.2)
Chloride: 104 mmol/L (ref 96–106)
Creatinine, Ser: 1 mg/dL (ref 0.57–1.00)
Globulin, Total: 2.4 g/dL (ref 1.5–4.5)
Glucose: 79 mg/dL (ref 70–99)
Potassium: 4.7 mmol/L (ref 3.5–5.2)
Sodium: 139 mmol/L (ref 134–144)
Total Protein: 7 g/dL (ref 6.0–8.5)
eGFR: 74 mL/min/{1.73_m2} (ref >59)

## 2022-02-03 LAB — HEMOGLOBIN A1C
Est. average glucose Bld gHb Est-mCnc: 111 mg/dL
Hgb A1c MFr Bld: 5.5 % (ref 4.8–5.6)

## 2022-02-09 ENCOUNTER — Ambulatory Visit: Payer: No Typology Code available for payment source | Admitting: Internal Medicine

## 2022-02-13 ENCOUNTER — Other Ambulatory Visit: Payer: Self-pay | Admitting: Internal Medicine

## 2022-03-30 ENCOUNTER — Ambulatory Visit (INDEPENDENT_AMBULATORY_CARE_PROVIDER_SITE_OTHER): Payer: No Typology Code available for payment source | Admitting: Internal Medicine

## 2022-03-30 ENCOUNTER — Encounter: Payer: Self-pay | Admitting: Internal Medicine

## 2022-03-30 VITALS — BP 150/80 | HR 94 | Temp 98.5°F | Ht 61.4 in | Wt 169.4 lb

## 2022-03-30 DIAGNOSIS — Z6831 Body mass index (BMI) 31.0-31.9, adult: Secondary | ICD-10-CM

## 2022-03-30 DIAGNOSIS — R030 Elevated blood-pressure reading, without diagnosis of hypertension: Secondary | ICD-10-CM

## 2022-03-30 DIAGNOSIS — Z0000 Encounter for general adult medical examination without abnormal findings: Secondary | ICD-10-CM

## 2022-03-30 DIAGNOSIS — E6609 Other obesity due to excess calories: Secondary | ICD-10-CM | POA: Diagnosis not present

## 2022-03-30 MED ORDER — WEGOVY 1.7 MG/0.75ML ~~LOC~~ SOAJ: 1.7000 mg | SUBCUTANEOUS | 0 refills | Status: DC

## 2022-03-30 NOTE — Progress Notes (Signed)
Jeri Cos [place]ittleton,acting as a Neurosurgeon for Gwynneth Aliment, MD.,have documented all relevant documentation on the behalf of Gwynneth Aliment, MD,as directed by  Gwynneth Aliment, MD while in the presence of Gwynneth Aliment, MD.   Subjective:     Patient ID: Kathleen Quinn , female    DOB: Jul 09, 1986 , 36 y.o.   MRN: [id number]   Chief Complaint  Patient presents with   Annual Exam    HPI  Patient presents today for a physical. Patient reports compliance with her meds. Patient goes to Dr.Tomblin for her GYN care. She reported she had a pap done within the last year. She has no specific concerns or complaints at this time.      Past Medical History:  Diagnosis Date   Obesity      Family History  Problem Relation Age of Onset   Hypertension Mother    Cancer Father    Cancer Maternal Grandmother      Current Outpatient Medications:    Semaglutide-Weight Management (WEGOVY) 1.7 MG/0.75ML SOAJ, Inject 1.7 mg into the skin once a week., Disp: 3 mL, Rfl: 0   No Known Allergies    The patient states she uses none for birth control. Last LMP was Patient's last menstrual period was 03/13/2022.. Negative for Dysmenorrhea. Negative for: breast discharge, breast lump(s), breast pain and breast self exam. Associated symptoms include abnormal vaginal bleeding. Pertinent negatives include abnormal bleeding (hematology), anxiety, decreased libido, depression, difficulty falling sleep, dyspareunia, history of infertility, nocturia, sexual dysfunction, sleep disturbances, urinary incontinence, urinary urgency, vaginal discharge and vaginal itching. Diet regular.The patient states her exercise level is  moderate.   . The patient's tobacco use is:  Social History   Tobacco Use  Smoking Status Never  Smokeless Tobacco Never  . She has been exposed to passive smoke. The patient's alcohol use is:  Social History   Substance and Sexual Activity  Alcohol Use Never   Review of Systems   Constitutional: Negative.   HENT: Negative.    Eyes: Negative.   Respiratory: Negative.    Cardiovascular: Negative.   Gastrointestinal: Negative.   Endocrine: Negative.   Genitourinary: Negative.   Musculoskeletal: Negative.   Skin: Negative.   Allergic/Immunologic: Negative.   Neurological: Negative.   Hematological: Negative.   Psychiatric/Behavioral: Negative.       Today's Vitals   03/30/22 1614 03/30/22 1633  BP: (!) 180/90 (!) 150/80  Pulse: 94   Temp: 98.5 F (36.9 C)   Weight: 169 lb 6.4 oz (76.8 kg)   Height: 5' 1.4" (1.56 m)   PainSc: 0-No pain    Body mass index is 31.59 kg/m.  Wt Readings from Last 3 Encounters:  03/30/22 169 lb 6.4 oz (76.8 kg)  02/02/22 171 lb 6.4 oz (77.7 kg)  09/13/21 172 lb 6.4 oz (78.2 kg)     Objective:  Physical Exam Vitals and nursing note reviewed.  Constitutional:      Appearance: Normal appearance.  HENT:     Head: Normocephalic and atraumatic.     Right Ear: Tympanic membrane, ear canal and external ear normal.     Left Ear: Tympanic membrane, ear canal and external ear normal.     Nose:     Comments: Masked     Mouth/Throat:     Comments: Masked  Eyes:     Extraocular Movements: Extraocular movements intact.     Conjunctiva/sclera: Conjunctivae normal.     Pupils: Pupils are equal, round, and reactive to  light.  Cardiovascular:     Rate and Rhythm: Normal rate and regular rhythm.     Pulses: Normal pulses.     Heart sounds: Normal heart sounds.  Pulmonary:     Effort: Pulmonary effort is normal.     Breath sounds: Normal breath sounds.  Chest:  Breasts:    Tanner Score is 5.     Right: Normal.     Left: Normal.  Abdominal:     General: Bowel sounds are normal.     Palpations: Abdomen is soft.  Genitourinary:    Comments: deferred Musculoskeletal:        General: Normal range of motion.     Cervical back: Normal range of motion and neck supple.  Skin:    General: Skin is warm and dry.  Neurological:      General: No focal deficit present.     Mental Status: She is alert and oriented to person, place, and time.  Psychiatric:        Mood and Affect: Mood normal.        Behavior: Behavior normal.      Assessment And Plan:     1. Encounter for general adult medical examination w/o abnormal findings Comments: A full exam was performed. Importance of monthly self breast exams was discussed with the patient. She is up to date with pap smears. I will request a copy of most recent pap. PATIENT IS ADVISED TO GET 30-45 MINUTES REGULAR EXERCISE NO LESS THAN FOUR TO FIVE DAYS PER WEEK - BOTH WEIGHTBEARING EXERCISES AND AEROBIC ARE RECOMMENDED.  PATIENT IS ADVISED TO FOLLOW A HEALTHY DIET WITH AT LEAST SIX FRUITS/VEGGIES PER DAY, DECREASE INTAKE OF RED MEAT, AND TO INCREASE FISH INTAKE TO TWO DAYS PER WEEK.  MEATS/FISH SHOULD NOT BE FRIED, BAKED OR BROILED IS PREFERABLE.  IT IS ALSO IMPORTANT TO CUT BACK ON YOUR SUGAR INTAKE. PLEASE AVOID ANYTHING WITH ADDED SUGAR, CORN SYRUP OR OTHER SWEETENERS. IF YOU MUST USE A SWEETENER, YOU CAN TRY STEVIA. IT IS ALSO IMPORTANT TO AVOID ARTIFICIALLY SWEETENERS AND DIET BEVERAGES. LASTLY, I SUGGEST WEARING SPF 50 SUNSCREEN ON EXPOSED PARTS AND ESPECIALLY WHEN IN THE DIRECT SUNLIGHT FOR AN EXTENDED PERIOD OF TIME.  PLEASE AVOID FAST FOOD RESTAURANTS AND INCREASE YOUR WATER INTAKE. - CBC - Lipid panel  2. Elevated blood pressure reading Comments: She is encouraged to cut back on her salt intake. EKG performed, NSR w/o acute changes. She agrees to monitor BP at work, will return in 2-3 wks for nurse visit - EKG 12-Lead  3. Class 1 obesity due to excess calories without serious comorbidity with body mass index (BMI) of 31.0 to 31.9 in adult Comments: She has lost 2 lbs since July. She agrees to increase Wegovy to 1.7mg  weekly. F/u in 8 wks. She will c/w her regular exercise regimen.   Patient was given opportunity to ask questions. Patient verbalized understanding of the  plan and was able to repeat key elements of the plan. All questions were answered to their satisfaction.   I, Gwynneth Aliment, MD, have reviewed all documentation for this visit. The documentation on 03/30/22 for the exam, diagnosis, procedures, and orders are all accurate and complete.   THE PATIENT IS ENCOURAGED TO PRACTICE SOCIAL DISTANCING DUE TO THE COVID-19 PANDEMIC.

## 2022-03-30 NOTE — Patient Instructions (Signed)

## 2022-03-31 LAB — CBC
Hematocrit: 38.8 % (ref 34.0–46.6)
Hemoglobin: 12.5 g/dL (ref 11.1–15.9)
MCH: 27.9 pg (ref 26.6–33.0)
MCHC: 32.2 g/dL (ref 31.5–35.7)
MCV: 87 fL (ref 79–97)
Platelets: 244 10*3/uL (ref 150–450)
RBC: 4.48 x10E6/uL (ref 3.77–5.28)
RDW: 12.6 % (ref 11.7–15.4)
WBC: 6 10*3/uL (ref 3.4–10.8)

## 2022-03-31 LAB — LIPID PANEL
Chol/HDL Ratio: 2.6 ratio (ref 0.0–4.4)
Cholesterol, Total: 187 mg/dL (ref 100–199)
HDL: 71 mg/dL (ref >39)
LDL Chol Calc (NIH): 104 mg/dL — ABNORMAL HIGH (ref 0–99)
Triglycerides: 64 mg/dL (ref 0–149)
VLDL Cholesterol Cal: 12 mg/dL (ref 5–40)

## 2022-04-06 ENCOUNTER — Telehealth: Payer: Self-pay

## 2022-04-06 NOTE — Telephone Encounter (Signed)
Patient notified that her PA has been approved until next year 04/06/23. YL,RMA

## 2022-04-14 ENCOUNTER — Encounter: Payer: Self-pay | Admitting: Internal Medicine

## 2022-04-15 ENCOUNTER — Encounter: Payer: Self-pay | Admitting: Internal Medicine

## 2022-04-25 ENCOUNTER — Ambulatory Visit: Payer: No Typology Code available for payment source

## 2022-04-25 VITALS — BP 144/82 | HR 89 | Temp 98.6°F

## 2022-04-25 DIAGNOSIS — R030 Elevated blood-pressure reading, without diagnosis of hypertension: Secondary | ICD-10-CM

## 2022-04-25 MED ORDER — AMLODIPINE BESYLATE 2.5 MG PO TABS: 2.5000 mg | ORAL_TABLET | Freq: Every day | ORAL | 11 refills | Status: DC

## 2022-04-25 NOTE — Patient Instructions (Signed)

## 2022-04-25 NOTE — Progress Notes (Signed)
Patient  is present for BPC. She currently is not taking any medication for her BP.  BP Readings from Last 3 Encounters:  04/25/22 (!) 140/80  03/30/22 (!) 150/80  02/02/22 138/68   Pt advised to start amlodipine 2.5mg  at night. She will return in 2 weeks.

## 2022-05-09 ENCOUNTER — Ambulatory Visit: Payer: No Typology Code available for payment source

## 2022-05-09 VITALS — BP 136/78 | HR 94 | Temp 98.5°F

## 2022-05-09 DIAGNOSIS — R030 Elevated blood-pressure reading, without diagnosis of hypertension: Secondary | ICD-10-CM

## 2022-05-09 MED ORDER — AMLODIPINE BESYLATE 5 MG PO TABS: 5.0000 mg | ORAL_TABLET | Freq: Every day | ORAL | 2 refills | Status: DC

## 2022-05-09 NOTE — Patient Instructions (Signed)
Hypertension, Adult High blood pressure (hypertension) is when the force of blood pumping through the arteries is too strong. The arteries are the blood vessels that carry blood from the heart throughout the body. Hypertension forces the heart to work harder to pump blood and may cause arteries to become narrow or stiff. Untreated or uncontrolled hypertension can lead to a heart attack, heart failure, a stroke, kidney disease, and other problems. A blood pressure reading consists of a higher number over a lower number. Ideally, your blood pressure should be below 120/80. The first ("top") number is called the systolic pressure. It is a measure of the pressure in your arteries as your heart beats. The second ("bottom") number is called the diastolic pressure. It is a measure of the pressure in your arteries as the heart relaxes. What are the causes? The exact cause of this condition is not known. There are some conditions that result in high blood pressure. What increases the risk? Certain factors may make you more likely to develop high blood pressure. Some of these risk factors are under your control, including: Smoking. Not getting enough exercise or physical activity. Being overweight. Having too much fat, sugar, calories, or salt (sodium) in your diet. Drinking too much alcohol. Other risk factors include: Having a personal history of heart disease, diabetes, high cholesterol, or kidney disease. Stress. Having a family history of high blood pressure and high cholesterol. Having obstructive sleep apnea. Age. The risk increases with age. What are the signs or symptoms? High blood pressure may not cause symptoms. Very high blood pressure (hypertensive crisis) may cause: Headache. Fast or irregular heartbeats (palpitations). Shortness of breath. Nosebleed. Nausea and vomiting. Vision changes. Severe chest pain, dizziness, and seizures. How is this diagnosed? This condition is diagnosed by  measuring your blood pressure while you are seated, with your arm resting on a flat surface, your legs uncrossed, and your feet flat on the floor. The cuff of the blood pressure monitor will be placed directly against the skin of your upper arm at the level of your heart. Blood pressure should be measured at least twice using the same arm. Certain conditions can cause a difference in blood pressure between your right and left arms. If you have a high blood pressure reading during one visit or you have normal blood pressure with other risk factors, you may be asked to: Return on a different day to have your blood pressure checked again. Monitor your blood pressure at home for 1 week or longer. If you are diagnosed with hypertension, you may have other blood or imaging tests to help your health care provider understand your overall risk for other conditions. How is this treated? This condition is treated by making healthy lifestyle changes, such as eating healthy foods, exercising more, and reducing your alcohol intake. You may be referred for counseling on a healthy diet and physical activity. Your health care provider may prescribe medicine if lifestyle changes are not enough to get your blood pressure under control and if: Your systolic blood pressure is above 130. Your diastolic blood pressure is above 80. Your personal target blood pressure may vary depending on your medical conditions, your age, and other factors. Follow these instructions at home: Eating and drinking  Eat a diet that is high in fiber and potassium, and low in sodium, added sugar, and fat. An example of this eating plan is called the DASH diet. DASH stands for Dietary Approaches to Stop Hypertension. To eat this way: Eat   plenty of fresh fruits and vegetables. Try to fill one half of your plate at each meal with fruits and vegetables. Eat whole grains, such as whole-wheat pasta, brown rice, or whole-grain bread. Fill about one  fourth of your plate with whole grains. Eat or drink low-fat dairy products, such as skim milk or low-fat yogurt. Avoid fatty cuts of meat, processed or cured meats, and poultry with skin. Fill about one fourth of your plate with lean proteins, such as fish, chicken without skin, beans, eggs, or tofu. Avoid pre-made and processed foods. These tend to be higher in sodium, added sugar, and fat. Reduce your daily sodium intake. Many people with hypertension should eat less than 1,500 mg of sodium a day. Do not drink alcohol if: Your health care provider tells you not to drink. You are pregnant, may be pregnant, or are planning to become pregnant. If you drink alcohol: Limit how much you have to: 0-1 drink a day for women. 0-2 drinks a day for men. Know how much alcohol is in your drink. In the [place] one drink equals one 12 oz bottle of beer (355 mL), one 5 oz glass of wine (148 mL), or one 1 oz glass of hard liquor (44 mL). Lifestyle  Work with your health care provider to maintain a healthy body weight or to lose weight. Ask what an ideal weight is for you. Get at least 30 minutes of exercise that causes your heart to beat faster (aerobic exercise) most days of the week. Activities may include walking, swimming, or biking. Include exercise to strengthen your muscles (resistance exercise), such as Pilates or lifting weights, as part of your weekly exercise routine. Try to do these types of exercises for 30 minutes at least 3 days a week. Do not use any products that contain nicotine or tobacco. These products include cigarettes, chewing tobacco, and vaping devices, such as e-cigarettes. If you need help quitting, ask your health care provider. Monitor your blood pressure at home as told by your health care provider. Keep all follow-up visits. This is important. Medicines Take over-the-counter and prescription medicines only as told by your health care provider. Follow directions carefully. Blood  pressure medicines must be taken as prescribed. Do not skip doses of blood pressure medicine. Doing this puts you at risk for problems and can make the medicine less effective. Ask your health care provider about side effects or reactions to medicines that you should watch for. Contact a health care provider if you: Think you are having a reaction to a medicine you are taking. Have headaches that keep coming back (recurring). Feel dizzy. Have swelling in your ankles. Have trouble with your vision. Get help right away if you: Develop a severe headache or confusion. Have unusual weakness or numbness. Feel faint. Have severe pain in your chest or abdomen. Vomit repeatedly. Have trouble breathing. These symptoms may be an emergency. Get help right away. Call 911. Do not wait to see if the symptoms will go away. Do not drive yourself to the hospital. Summary Hypertension is when the force of blood pumping through your arteries is too strong. If this condition is not controlled, it may put you at risk for serious complications. Your personal target blood pressure may vary depending on your medical conditions, your age, and other factors. For most people, a normal blood pressure is less than 120/80. Hypertension is treated with lifestyle changes, medicines, or a combination of both. Lifestyle changes include losing weight, eating a healthy,   low-sodium diet, exercising more, and limiting alcohol. This information is not intended to replace advice given to you by your health care provider. Make sure you discuss any questions you have with your health care provider. Document Revised: 05/10/2021 Document Reviewed: 05/10/2021 Elsevier Patient Education  2023 Elsevier Inc.  

## 2022-05-09 NOTE — Progress Notes (Signed)
Patient presents today for BPC. She is currently taking amlodipine 2.5. she does admit that she does not have a scheduled time to take this medication. She states that she keeps it in the car and takes when she remembers.  BP Readings from Last 3 Encounters:  05/09/22 136/78  04/25/22 (!) 144/82  03/30/22 (!) 150/80   Patient will follow up with provider. Pt advised to increase water intake and decrease salt intake.

## 2022-06-14 ENCOUNTER — Other Ambulatory Visit: Payer: Self-pay | Admitting: Internal Medicine

## 2022-06-15 ENCOUNTER — Encounter: Payer: Self-pay | Admitting: Internal Medicine

## 2022-06-15 ENCOUNTER — Ambulatory Visit (INDEPENDENT_AMBULATORY_CARE_PROVIDER_SITE_OTHER): Payer: No Typology Code available for payment source | Admitting: Internal Medicine

## 2022-06-15 VITALS — BP 150/80 | HR 95 | Temp 98.3°F | Ht 61.0 in | Wt 164.6 lb

## 2022-06-15 DIAGNOSIS — E6609 Other obesity due to excess calories: Secondary | ICD-10-CM

## 2022-06-15 DIAGNOSIS — I10 Essential (primary) hypertension: Secondary | ICD-10-CM

## 2022-06-15 DIAGNOSIS — Z6831 Body mass index (BMI) 31.0-31.9, adult: Secondary | ICD-10-CM

## 2022-06-15 NOTE — Patient Instructions (Signed)
Hypertension, Adult ?Hypertension is another name for high blood pressure. High blood pressure forces your heart to work harder to pump blood. This can cause problems over time. ?There are two numbers in a blood pressure reading. There is a top number (systolic) over a bottom number (diastolic). It is best to have a blood pressure that is below 120/80. ?What are the causes? ?The cause of this condition is not known. Some other conditions can lead to high blood pressure. ?What increases the risk? ?Some lifestyle factors can make you more likely to develop high blood pressure: ?Smoking. ?Not getting enough exercise or physical activity. ?Being overweight. ?Having too much fat, sugar, calories, or salt (sodium) in your diet. ?Drinking too much alcohol. ?Other risk factors include: ?Having any of these conditions: ?Heart disease. ?Diabetes. ?High cholesterol. ?Kidney disease. ?Obstructive sleep apnea. ?Having a family history of high blood pressure and high cholesterol. ?Age. The risk increases with age. ?Stress. ?What are the signs or symptoms? ?High blood pressure may not cause symptoms. Very high blood pressure (hypertensive crisis) may cause: ?Headache. ?Fast or uneven heartbeats (palpitations). ?Shortness of breath. ?Nosebleed. ?Vomiting or feeling like you may vomit (nauseous). ?Changes in how you see. ?Very bad chest pain. ?Feeling dizzy. ?Seizures. ?How is this treated? ?This condition is treated by making healthy lifestyle changes, such as: ?Eating healthy foods. ?Exercising more. ?Drinking less alcohol. ?Your doctor may prescribe medicine if lifestyle changes do not help enough and if: ?Your top number is above 130. ?Your bottom number is above 80. ?Your personal target blood pressure may vary. ?Follow these instructions at home: ?Eating and drinking ? ?If told, follow the DASH eating plan. To follow this plan: ?Fill one half of your plate at each meal with fruits and vegetables. ?Fill one fourth of your plate  at each meal with whole grains. Whole grains include whole-wheat pasta, brown rice, and whole-grain bread. ?Eat or drink low-fat dairy products, such as skim milk or low-fat yogurt. ?Fill one fourth of your plate at each meal with low-fat (lean) proteins. Low-fat proteins include fish, chicken without skin, eggs, beans, and tofu. ?Avoid fatty meat, cured and processed meat, or chicken with skin. ?Avoid pre-made or processed food. ?Limit the amount of salt in your diet to less than 1,500 mg each day. ?Do not drink alcohol if: ?Your doctor tells you not to drink. ?You are pregnant, may be pregnant, or are planning to become pregnant. ?If you drink alcohol: ?Limit how much you have to: ?0-1 drink a day for women. ?0-2 drinks a day for men. ?Know how much alcohol is in your drink. In the [place] one drink equals one 12 oz bottle of beer (355 mL), one 5 oz glass of wine (148 mL), or one 1? oz glass of hard liquor (44 mL). ?Lifestyle ? ?Work with your doctor to stay at a healthy weight or to lose weight. Ask your doctor what the best weight is for you. ?Get at least 30 minutes of exercise that causes your heart to beat faster (aerobic exercise) most days of the week. This may include walking, swimming, or biking. ?Get at least 30 minutes of exercise that strengthens your muscles (resistance exercise) at least 3 days a week. This may include lifting weights or doing Pilates. ?Do not smoke or use any products that contain nicotine or tobacco. If you need help quitting, ask your doctor. ?Check your blood pressure at home as told by your doctor. ?Keep all follow-up visits. ?Medicines ?Take over-the-counter and prescription medicines   only as told by your doctor. Follow directions carefully. ?Do not skip doses of blood pressure medicine. The medicine does not work as well if you skip doses. Skipping doses also puts you at risk for problems. ?Ask your doctor about side effects or reactions to medicines that you should watch  for. ?Contact a doctor if: ?You think you are having a reaction to the medicine you are taking. ?You have headaches that keep coming back. ?You feel dizzy. ?You have swelling in your ankles. ?You have trouble with your vision. ?Get help right away if: ?You get a very bad headache. ?You start to feel mixed up (confused). ?You feel weak or numb. ?You feel faint. ?You have very bad pain in your: ?Chest. ?Belly (abdomen). ?You vomit more than once. ?You have trouble breathing. ?These symptoms may be an emergency. Get help right away. Call 911. ?Do not wait to see if the symptoms will go away. ?Do not drive yourself to the hospital. ?Summary ?Hypertension is another name for high blood pressure. ?High blood pressure forces your heart to work harder to pump blood. ?For most people, a normal blood pressure is less than 120/80. ?Making healthy choices can help lower blood pressure. If your blood pressure does not get lower with healthy choices, you may need to take medicine. ?This information is not intended to replace advice given to you by your health care provider. Make sure you discuss any questions you have with your health care provider. ?Document Revised: 04/21/2021 Document Reviewed: 04/21/2021 ?Elsevier Patient Education ? 2023 Elsevier Inc. ? ?

## 2022-06-15 NOTE — Progress Notes (Signed)
Hershal Coria Martin,acting as a Neurosurgeon for Gwynneth Aliment, MD.,have documented all relevant documentation on the behalf of Gwynneth Aliment, MD,as directed by  Gwynneth Aliment, MD while in the presence of Gwynneth Aliment, MD.    Subjective:     Patient ID: Kathleen Quinn , female    DOB: [birth year] , 36 y.o.   MRN: [id number]   Chief Complaint  Patient presents with   Hypertension    HPI  Patient presents today for a BP check, Patient reports compliance with medications. She has been taking amlodipine 5mg  daily without any issues. She admits she is not always consistent with her dosing. Patient has no other concerns today.   BP Readings from Last 3 Encounters: 06/15/22 : (!) 150/84 05/09/22 : 136/78 04/25/22 : (!) 144/82    Hypertension This is a new problem. The problem is unchanged. The problem is uncontrolled. Pertinent negatives include no chest pain, headaches or neck pain.     Past Medical History:  Diagnosis Date   Obesity      Family History  Problem Relation Age of Onset   Hypertension Mother    Cancer Father    Cancer Maternal Grandmother      Current Outpatient Medications:    amLODipine (NORVASC) 5 MG tablet, Take 1 tablet (5 mg total) by mouth daily., Disp: 30 tablet, Rfl: 2   WEGOVY 1.7 MG/0.75ML SOAJ, INJECT 1.7 MG INTO THE SKIN ONCE A WEEK, Disp: 3 mL, Rfl: 0   No Known Allergies   Review of Systems  Constitutional: Negative.   HENT: Negative.    Eyes: Negative.   Respiratory: Negative.    Cardiovascular: Negative.  Negative for chest pain.  Gastrointestinal: Negative.   Musculoskeletal:  Negative for neck pain.  Neurological:  Negative for headaches.     Today's Vitals   06/15/22 1542 06/15/22 1605  BP: (!) 150/84 (!) 150/80  Pulse: 95   Temp: 98.3 F (36.8 C)   TempSrc: Oral   Weight: 164 lb 9.6 oz (74.7 kg)   Height: 5\' 1"  (1.549 m)   PainSc: 0-No pain    Body mass index is 31.1 kg/m.  Wt Readings from Last 3 Encounters:   06/15/22 164 lb 9.6 oz (74.7 kg)  03/30/22 169 lb 6.4 oz (76.8 kg)  02/02/22 171 lb 6.4 oz (77.7 kg)   BP Readings from Last 3 Encounters:  06/15/22 (!) 150/80  05/09/22 136/78  04/25/22 (!) 144/82    Objective:  Physical Exam Vitals and nursing note reviewed.  Constitutional:      Appearance: Normal appearance.  HENT:     Head: Normocephalic and atraumatic.     Nose:     Comments: Masked     Mouth/Throat:     Comments: Masked  Eyes:     Extraocular Movements: Extraocular movements intact.  Cardiovascular:     Rate and Rhythm: Normal rate and regular rhythm.     Heart sounds: Normal heart sounds.  Pulmonary:     Effort: Pulmonary effort is normal.     Breath sounds: Normal breath sounds.  Musculoskeletal:     Cervical back: Normal range of motion.  Skin:    General: Skin is warm.  Neurological:     General: No focal deficit present.     Mental Status: She is alert.  Psychiatric:        Mood and Affect: Mood normal.        Behavior: Behavior normal.  Assessment And Plan:     1. Essential hypertension, benign Comments: Uncontrolled. I planned to increase her dose, pt wishes to first work on improved compliance. She agrees to rto in 2-3 wks for nurse visit.  2. Class 1 obesity due to excess calories with body mass index (BMI) of 31.0 to 31.9 in adult, unspecified whether serious comorbidity present Comments: She has lost another 5 lbs! She will c/w Wegovy 1.7mg  weekly. She will f/u in 10 wks for re-evaluation.     Patient was given opportunity to ask questions. Patient verbalized understanding of the plan and was able to repeat key elements of the plan. All questions were answered to their satisfaction.   I, Gwynneth Aliment, MD, have reviewed all documentation for this visit. The documentation on 06/15/22 for the exam, diagnosis, procedures, and orders are all accurate and complete.   IF YOU HAVE BEEN REFERRED TO A SPECIALIST, IT MAY TAKE 1-2 WEEKS TO  SCHEDULE/PROCESS THE REFERRAL. IF YOU HAVE NOT HEARD FROM US/SPECIALIST IN TWO WEEKS, PLEASE GIVE [place] A CALL AT [phone] X [phone].   THE PATIENT IS ENCOURAGED TO PRACTICE SOCIAL DISTANCING DUE TO THE COVID-19 PANDEMIC.

## 2022-07-04 ENCOUNTER — Ambulatory Visit: Payer: No Typology Code available for payment source

## 2022-07-04 VITALS — BP 128/70 | HR 92 | Temp 98.8°F

## 2022-07-04 DIAGNOSIS — I10 Essential (primary) hypertension: Secondary | ICD-10-CM

## 2022-07-04 NOTE — Progress Notes (Signed)
Patient presents today for BPC. She is currently taking Amlodipine 5mg .   BP Readings from Last 3 Encounters:  07/04/22 128/70  06/15/22 (!) 150/80  05/09/22 136/78   Patient will continue with current medication at this time and follow up with provider as scheduled.

## 2022-07-04 NOTE — Patient Instructions (Signed)
Hypertension, Adult High blood pressure (hypertension) is when the force of blood pumping through the arteries is too strong. The arteries are the blood vessels that carry blood from the heart throughout the body. Hypertension forces the heart to work harder to pump blood and may cause arteries to become narrow or stiff. Untreated or uncontrolled hypertension can lead to a heart attack, heart failure, a stroke, kidney disease, and other problems. A blood pressure reading consists of a higher number over a lower number. Ideally, your blood pressure should be below 120/80. The first ("top") number is called the systolic pressure. It is a measure of the pressure in your arteries as your heart beats. The second ("bottom") number is called the diastolic pressure. It is a measure of the pressure in your arteries as the heart relaxes. What are the causes? The exact cause of this condition is not known. There are some conditions that result in high blood pressure. What increases the risk? Certain factors may make you more likely to develop high blood pressure. Some of these risk factors are under your control, including: Smoking. Not getting enough exercise or physical activity. Being overweight. Having too much fat, sugar, calories, or salt (sodium) in your diet. Drinking too much alcohol. Other risk factors include: Having a personal history of heart disease, diabetes, high cholesterol, or kidney disease. Stress. Having a family history of high blood pressure and high cholesterol. Having obstructive sleep apnea. Age. The risk increases with age. What are the signs or symptoms? High blood pressure may not cause symptoms. Very high blood pressure (hypertensive crisis) may cause: Headache. Fast or irregular heartbeats (palpitations). Shortness of breath. Nosebleed. Nausea and vomiting. Vision changes. Severe chest pain, dizziness, and seizures. How is this diagnosed? This condition is diagnosed by  measuring your blood pressure while you are seated, with your arm resting on a flat surface, your legs uncrossed, and your feet flat on the floor. The cuff of the blood pressure monitor will be placed directly against the skin of your upper arm at the level of your heart. Blood pressure should be measured at least twice using the same arm. Certain conditions can cause a difference in blood pressure between your right and left arms. If you have a high blood pressure reading during one visit or you have normal blood pressure with other risk factors, you may be asked to: Return on a different day to have your blood pressure checked again. Monitor your blood pressure at home for 1 week or longer. If you are diagnosed with hypertension, you may have other blood or imaging tests to help your health care provider understand your overall risk for other conditions. How is this treated? This condition is treated by making healthy lifestyle changes, such as eating healthy foods, exercising more, and reducing your alcohol intake. You may be referred for counseling on a healthy diet and physical activity. Your health care provider may prescribe medicine if lifestyle changes are not enough to get your blood pressure under control and if: Your systolic blood pressure is above 130. Your diastolic blood pressure is above 80. Your personal target blood pressure may vary depending on your medical conditions, your age, and other factors. Follow these instructions at home: Eating and drinking  Eat a diet that is high in fiber and potassium, and low in sodium, added sugar, and fat. An example of this eating plan is called the DASH diet. DASH stands for Dietary Approaches to Stop Hypertension. To eat this way: Eat   plenty of fresh fruits and vegetables. Try to fill one half of your plate at each meal with fruits and vegetables. Eat whole grains, such as whole-wheat pasta, brown rice, or whole-grain bread. Fill about one  fourth of your plate with whole grains. Eat or drink low-fat dairy products, such as skim milk or low-fat yogurt. Avoid fatty cuts of meat, processed or cured meats, and poultry with skin. Fill about one fourth of your plate with lean proteins, such as fish, chicken without skin, beans, eggs, or tofu. Avoid pre-made and processed foods. These tend to be higher in sodium, added sugar, and fat. Reduce your daily sodium intake. Many people with hypertension should eat less than 1,500 mg of sodium a day. Do not drink alcohol if: Your health care provider tells you not to drink. You are pregnant, may be pregnant, or are planning to become pregnant. If you drink alcohol: Limit how much you have to: 0-1 drink a day for women. 0-2 drinks a day for men. Know how much alcohol is in your drink. In the [place] one drink equals one 12 oz bottle of beer (355 mL), one 5 oz glass of wine (148 mL), or one 1 oz glass of hard liquor (44 mL). Lifestyle  Work with your health care provider to maintain a healthy body weight or to lose weight. Ask what an ideal weight is for you. Get at least 30 minutes of exercise that causes your heart to beat faster (aerobic exercise) most days of the week. Activities may include walking, swimming, or biking. Include exercise to strengthen your muscles (resistance exercise), such as Pilates or lifting weights, as part of your weekly exercise routine. Try to do these types of exercises for 30 minutes at least 3 days a week. Do not use any products that contain nicotine or tobacco. These products include cigarettes, chewing tobacco, and vaping devices, such as e-cigarettes. If you need help quitting, ask your health care provider. Monitor your blood pressure at home as told by your health care provider. Keep all follow-up visits. This is important. Medicines Take over-the-counter and prescription medicines only as told by your health care provider. Follow directions carefully. Blood  pressure medicines must be taken as prescribed. Do not skip doses of blood pressure medicine. Doing this puts you at risk for problems and can make the medicine less effective. Ask your health care provider about side effects or reactions to medicines that you should watch for. Contact a health care provider if you: Think you are having a reaction to a medicine you are taking. Have headaches that keep coming back (recurring). Feel dizzy. Have swelling in your ankles. Have trouble with your vision. Get help right away if you: Develop a severe headache or confusion. Have unusual weakness or numbness. Feel faint. Have severe pain in your chest or abdomen. Vomit repeatedly. Have trouble breathing. These symptoms may be an emergency. Get help right away. Call 911. Do not wait to see if the symptoms will go away. Do not drive yourself to the hospital. Summary Hypertension is when the force of blood pumping through your arteries is too strong. If this condition is not controlled, it may put you at risk for serious complications. Your personal target blood pressure may vary depending on your medical conditions, your age, and other factors. For most people, a normal blood pressure is less than 120/80. Hypertension is treated with lifestyle changes, medicines, or a combination of both. Lifestyle changes include losing weight, eating a healthy,   low-sodium diet, exercising more, and limiting alcohol. This information is not intended to replace advice given to you by your health care provider. Make sure you discuss any questions you have with your health care provider. Document Revised: 05/10/2021 Document Reviewed: 05/10/2021 Elsevier Patient Education  2023 Elsevier Inc.  

## 2022-07-12 ENCOUNTER — Other Ambulatory Visit: Payer: Self-pay | Admitting: Internal Medicine

## 2022-08-03 ENCOUNTER — Encounter: Payer: Self-pay | Admitting: Internal Medicine

## 2022-08-03 ENCOUNTER — Ambulatory Visit (INDEPENDENT_AMBULATORY_CARE_PROVIDER_SITE_OTHER): Payer: 59 | Admitting: Internal Medicine

## 2022-08-03 VITALS — BP 126/88 | HR 86 | Temp 98.3°F | Ht 61.0 in | Wt 164.4 lb

## 2022-08-03 DIAGNOSIS — N898 Other specified noninflammatory disorders of vagina: Secondary | ICD-10-CM

## 2022-08-03 DIAGNOSIS — I10 Essential (primary) hypertension: Secondary | ICD-10-CM

## 2022-08-03 DIAGNOSIS — E6609 Other obesity due to excess calories: Secondary | ICD-10-CM

## 2022-08-03 DIAGNOSIS — N920 Excessive and frequent menstruation with regular cycle: Secondary | ICD-10-CM | POA: Diagnosis not present

## 2022-08-03 DIAGNOSIS — H6992 Unspecified Eustachian tube disorder, left ear: Secondary | ICD-10-CM

## 2022-08-03 DIAGNOSIS — Z6831 Body mass index (BMI) 31.0-31.9, adult: Secondary | ICD-10-CM

## 2022-08-03 NOTE — Patient Instructions (Signed)
Toxic Shock Syndrome, Adult Toxic shock syndrome (TSS) is a condition that occurs when certain bacteria enter the body and release poisons called exotoxins into the bloodstream. The poisons can damage organs, such as the kidneys, liver, lungs, and brain. TSS is life-threatening and must be treated as soon as possible. The condition can result in: A dangerous drop in blood pressure (shock). Organ failure. Lung damage. Respiratory failure. Rash and ulcers. Blood clotting and bleeding problems. The condition can get worse very quickly and can spread to other parts of the body. What are the causes? This condition is often caused by one of these types of bacteria: Staphylococcus aureus. Streptococcus pyogenes, or Group A streptococci (GAS). Normally, these types of bacteria live on the skin or in mucous membranes without causing problems. TSS occurs after these bacteria enter the body through breaks, cracks, or wounds in the skin or other body tissue. TSS can develop if the bacteria grow out of control. What increases the risk? You are more likely to develop this condition if: You recently had surgery. You have a viral infection, such as chicken pox (varicella) or viral pneumonia. You have an open wound. You have a Staphylococcus aureus skin infection. You have foreign material inside your body, such as packings to stop nosebleeds. You have diabetes. You use tampons, especially super-absorbent tampons, and do not change them often. You use certain barrier methods of birth control, such as a diaphragm, vaginal sponge, or cervical cap. You recently gave birth. You recently had a procedure involving the sex organs. What are the signs or symptoms? Symptoms of this condition include: Fever. Muscle aches. Nausea and vomiting. Diarrhea. Headaches. Red rash that looks like sunburn. Redness of the mouth, throat, or eyes. Light-headed feeling or dizziness, especially when getting up from a lying  or sitting position. An infected wound. This may include: Redness, swelling, or pain around a wound. Fluid, blood, or pus coming from a wound. Confusion. Feeling very tired (lethargic). Swelling or peeling of the skin, especially around the nails and on the soles of the feet and the palms of the hands. How is this diagnosed? This condition may be diagnosed based on your symptoms, your medical history, and a physical exam. You may have tests to check for bacteria and to confirm the diagnosis. Tests include: Blood tests. Urine tests. A test of a fluid sample from the nose, throat, vagina, skin, spine, or wound. Imaging tests, such as X-ray, MRI, and CT scan. How is this treated? This condition is treated in the hospital. Treatment may include: Antibiotic medicines. Removal of any foreign material in the body. IV fluids and medicines that raise blood pressure and replace fluids and body salts (electrolytes) that were lost from vomiting or diarrhea. Medicine to raise blood pressure. A procedure to clear away damaged or infected skin. Using a machine to clean blood if kidneys have failed (dialysis). Breathing support. Follow these instructions at home:  Take over-the-counter and prescription medicines only as told by your health care provider. If you were prescribed antibiotic medicine, take it as told by your health care provider. Do not stop taking the antibiotic even if you start to feel better. If you have a wound, follow instructions from your health care provider about how to take care of it. If you are a woman, do not use tampons until the condition goes away. Return to your normal activities as told by your health care provider. Ask your health care provider what activities are safe for you. Keep  all follow-up visits. This is important. How is this prevented? For men and women: Understand how to take care of wounds properly to prevent infection. Do not use gauze packing for a  nosebleed. Care for your tattoos and piercings properly. For women: If you use tampons or barrier methods of birth control: Do not use them for more than 12 hours, or for the time your health care provider tells you. Do not use them for at least 12 weeks after delivering a baby. Wash your hands with soap and water before you insert or remove them. If you use tampons: Alternate the use of tampons with sanitary napkins or pads during your period. Try not to scratch the vaginal lining when inserting a tampon. Plastic applicators may make scratches more likely. If your vagina seems dry, use a water-soluble lubricating jelly to insert the tampon. Avoid using super-absorbent tampons. Choose tampons that have the lowest absorbency that meets your needs. Only use tampons that are 100% cotton. Change your tampon every 4-8 hours. If you want to sleep with a tampon in, put in a fresh tampon before you sleep and remove it immediately upon waking. Do not leave a tampon inserted overnight if you plan to sleep for more than 6-8 hours. You can avoid this risk by using sanitary napkins or pads at night instead of tampons. If you develop a fever or a rash while using a tampon, remove the tampon immediately. Do not use tampons between periods. Contact a health care provider if: You delivered a baby within the past 12 weeks and you are exposed to someone who has strep throat. Get help right away if: Your symptoms do not improve with treatment. Your symptoms get worse. You have: A severe headache. Severe bruising. Chest pain or difficulty breathing. Sudden or severe pain in your abdomen. Sudden bleeding from your nose or gums. Severe dizziness, or you faint. Your symptoms return after your condition improves. These symptoms may represent a serious problem that is an emergency. Do not wait to see if the symptoms will go away. Get medical help right away. Call your local emergency services (911 in the [place]  Do not drive yourself to the hospital. Summary Toxic shock syndrome (TSS) is a condition that occurs when certain bacteria enter the body and release poisons called exotoxins into the bloodstream. TSS is life-threatening and must be treated as soon as possible. This condition is treated in the hospital. If you were prescribed antibiotic medicine, take it as told by your health care provider. Do not stop taking the antibiotic even if you start to feel better. This information is not intended to replace advice given to you by your health care provider. Make sure you discuss any questions you have with your health care provider. Document Revised: 02/18/2020 Document Reviewed: 02/18/2020 Elsevier Patient Education  [place].

## 2022-08-03 NOTE — Progress Notes (Signed)
I,Kathleen Quinn,acting as a scribe for Kathleen Greenland, MD.,have documented all relevant documentation on the behalf of Kathleen Greenland, MD,as directed by  Kathleen Greenland, MD while in the presence of Kathleen Greenland, MD.    Subjective:     Patient ID: Kathleen Quinn , female    DOB: Mar 21, 1986 , 37 y.o.   MRN: [id number]   Chief Complaint  Patient presents with   Foreign Body in Vagina    HPI  Pt presents today for BP check. She reports improved compliance with meds. Denies having headaches, cp and sob.  However, she did have an episode of dizziness, n/v. She went to [facility] on 07/31/22 and was diagnosed with eustachian tube dysfunction. She was prescribed both meclizine and fluticasone NS with some improvement in her sx. However, she is still having ringing in her left ear. Ten days prior, she went to [facility] for evaluation of vaginal odor.   She admits to leaving a tampon in for a little over 2 weeks. She removed the tampon earlier today, with the assistance of her friend. She would like to have vaginal exam today because [facility] performed an exam.    She is currently spotting, expecting her cycle soon.         Past Medical History:  Diagnosis Date   Obesity      Family History  Problem Relation Age of Onset   Hypertension Mother    Cancer Father    Cancer Maternal Grandmother      Current Outpatient Medications:    amLODipine (NORVASC) 5 MG tablet, Take 1 tablet (5 mg total) by mouth daily., Disp: 30 tablet, Rfl: 2   fluticasone (FLONASE) 50 MCG/ACT nasal spray, Place into the nose., Disp: , Rfl:    meclizine (ANTIVERT) 25 MG tablet, Take 1 tablet by mouth 3 (three) times daily as needed., Disp: , Rfl:    WEGOVY 1.7 MG/0.75ML SOAJ, INJECT 1.7 MG UNDER THE SKIN ONCE WEEKLY (Patient not taking: Reported on 08/03/2022), Disp: 3 mL, Rfl: 0   No Known Allergies   Review of Systems  Constitutional: Negative.   Respiratory:  Negative.    Cardiovascular: Negative.   Genitourinary:  Positive for vaginal discharge.       Vaginal odor  Neurological: Negative.   Psychiatric/Behavioral: Negative.       Today's Vitals   08/03/22 1423 08/03/22 1456  BP: (!) 126/92 126/88  Pulse: 86   Temp: 98.3 F (36.8 C)   SpO2: 98%   Weight: 164 lb 6.4 oz (74.6 kg)   Height: 5\' 1"  (1.549 m)    Body mass index is 31.06 kg/m.  Wt Readings from Last 3 Encounters:  08/03/22 164 lb 6.4 oz (74.6 kg)  06/15/22 164 lb 9.6 oz (74.7 kg)  03/30/22 169 lb 6.4 oz (76.8 kg)    Objective:  Physical Exam Vitals and nursing note reviewed. Exam conducted with a chaperone present.  Constitutional:      Appearance: Normal appearance.  HENT:     Head: Normocephalic and atraumatic.     Nose:     Comments: Masked     Mouth/Throat:     Comments: Masked  Eyes:     Extraocular Movements: Extraocular movements intact.  Cardiovascular:     Rate and Rhythm: Normal rate and regular rhythm.     Heart sounds: Normal heart sounds.  Pulmonary:     Effort: Pulmonary effort is normal.  Breath sounds: Normal breath sounds.  Genitourinary:    General: Normal vulva.     Exam position: Lithotomy position.     Tanner stage (genital): 5.     Labia:        Right: No lesion.        Left: No lesion.      Vagina: Bleeding present.     Comments: Unable to visualize cervix due to blood in vault.  Musculoskeletal:     Cervical back: Normal range of motion.  Lymphadenopathy:     Lower Body: No right inguinal adenopathy. No left inguinal adenopathy.  Skin:    General: Skin is warm.  Neurological:     General: No focal deficit present.     Mental Status: She is alert.  Psychiatric:        Mood and Affect: Mood normal.        Behavior: Behavior normal.         Assessment And Plan:     1. Essential hypertension, benign Comments: Chronic, diastolic elevation. She is concerned about vaginal issues, will not change meds today.  2.  Eustachian tube dysfunction, left Comments: Resolving.  She was given samples of Norel Ad to take twice daily in hopes this will completely resolve sx.  I will consider ENT referral if her sx persist.  3. Vaginal odor Comments: I attempted to perform GYN exam; however, vaginal vault filled with blood. Unable to perform exam. Agrees to retry at her Feb visit.  4. Menorrhagia with regular cycle Comments: She is encouraged to discuss further with her GYN, wlil consider labwork at her next visit.  5. Class 1 obesity due to excess calories with serious comorbidity and body mass index (BMI) of 31.0 to 31.9 in adult Comments: Chronic, currently on Wegovy. She has not lost any weight in the past 8 weeks. She is encouraged to aim for at least 150 minutes of exercise/week.  Patient was given opportunity to ask questions. Patient verbalized understanding of the plan and was able to repeat key elements of the plan. All questions were answered to their satisfaction.   I, Kathleen Greenland, MD, have reviewed all documentation for this visit. The documentation on 08/03/22 for the exam, diagnosis, procedures, and orders are all accurate and complete.   IF YOU HAVE BEEN REFERRED TO A SPECIALIST, IT MAY TAKE 1-2 WEEKS TO SCHEDULE/PROCESS THE REFERRAL. IF YOU HAVE NOT HEARD FROM US/SPECIALIST IN TWO WEEKS, PLEASE GIVE [place] A CALL AT [phone].   THE PATIENT IS ENCOURAGED TO PRACTICE SOCIAL DISTANCING DUE TO THE COVID-19 PANDEMIC.

## 2022-08-24 ENCOUNTER — Ambulatory Visit (INDEPENDENT_AMBULATORY_CARE_PROVIDER_SITE_OTHER): Payer: 59 | Admitting: Internal Medicine

## 2022-08-24 ENCOUNTER — Encounter: Payer: Self-pay | Admitting: Internal Medicine

## 2022-08-24 ENCOUNTER — Other Ambulatory Visit (HOSPITAL_COMMUNITY)
Admission: RE | Admit: 2022-08-24 | Discharge: 2022-08-24 | Disposition: A | Discharge status: Home / Self Care | Payer: 59 | Source: Ambulatory Visit | Attending: Internal Medicine | Admitting: Internal Medicine

## 2022-08-24 VITALS — BP 126/88 | HR 99 | Temp 98.1°F | Ht 61.0 in | Wt 163.2 lb

## 2022-08-24 DIAGNOSIS — Z124 Encounter for screening for malignant neoplasm of cervix: Secondary | ICD-10-CM

## 2022-08-24 DIAGNOSIS — I10 Essential (primary) hypertension: Secondary | ICD-10-CM

## 2022-08-24 DIAGNOSIS — E6609 Other obesity due to excess calories: Secondary | ICD-10-CM | POA: Diagnosis not present

## 2022-08-24 DIAGNOSIS — H9312 Tinnitus, left ear: Secondary | ICD-10-CM | POA: Diagnosis not present

## 2022-08-24 DIAGNOSIS — N760 Acute vaginitis: Secondary | ICD-10-CM

## 2022-08-24 DIAGNOSIS — Z6830 Body mass index (BMI) 30.0-30.9, adult: Secondary | ICD-10-CM | POA: Diagnosis not present

## 2022-08-24 NOTE — Progress Notes (Signed)
I,[name],acting as a scribe for Maximino Greenland, MD.,have documented all relevant documentation on the behalf of Maximino Greenland, MD,as directed by  Maximino Greenland, MD while in the presence of Maximino Greenland, MD.    Subjective:     Patient ID: Kathleen Quinn , female    DOB: 10-Feb-1986 , 37 y.o.   MRN: [id number]   Chief Complaint  Patient presents with   Weight Check    HPI  Patient presents today for a BP and weight check. She reports compliance with [id number].  She states still experiencing ringing in her left ear. She states she does feel it is getting slightly better.  Denies headache , chest pain, SOB, blurred vision.    Hypertension This is a new problem. The problem is unchanged. The problem is uncontrolled. Pertinent negatives include no chest pain, headaches or neck pain. Past treatments include calcium channel blockers. The current treatment provides moderate improvement. There is no history of kidney disease, CAD/MI or heart failure. Chronic renal disease: t ear tinnitis.     Past Medical History:  Diagnosis Date   Obesity      Family History  Problem Relation Age of Onset   Hypertension Mother    Cancer Father    Cancer Maternal Grandmother      Current Outpatient Medications:    amLODipine (NORVASC) 5 MG tablet, Take 1 tablet (5 mg total) by mouth daily., Disp: 90 tablet, Rfl: 1   fluticasone (FLONASE) 50 MCG/ACT nasal spray, Place into the nose. (Patient not taking: Reported on 08/24/2022), Disp: , Rfl:    meclizine (ANTIVERT) 25 MG tablet, Take 1 tablet by mouth 3 (three) times daily as needed. (Patient not taking: Reported on 08/24/2022), Disp: , Rfl:    WEGOVY 1.7 MG/0.75ML SOAJ, INJECT 1.7 MG UNDER THE SKIN ONCE WEEKLY (Patient not taking: Reported on 08/03/2022), Disp: 3 mL, Rfl: 0   No Known Allergies   Review of Systems  Constitutional: Negative.   Respiratory: Negative.    Cardiovascular: Negative.  Negative for chest pain.   Musculoskeletal:  Negative for neck pain.  Neurological: Negative.  Negative for headaches.  Psychiatric/Behavioral: Negative.       Today's Vitals   08/24/22 1610 08/24/22 1654  BP: (!) 130/90 126/88  Pulse: 99   Temp: 98.1 F (36.7 C)   SpO2: 98%   Weight: 163 lb 3.2 oz (74 kg)   Height: '5\' 1"'$  (1.549 m)    Body mass index is 30.84 kg/m.  Wt Readings from Last 3 Encounters:  08/24/22 163 lb 3.2 oz (74 kg)  08/03/22 164 lb 6.4 oz (74.6 kg)  06/15/22 164 lb 9.6 oz (74.7 kg)    Objective:  Physical Exam Vitals and nursing note reviewed. Exam conducted with a chaperone present.  Constitutional:      Appearance: Normal appearance.  HENT:     Head: Normocephalic and atraumatic.     Nose:     Comments: Masked     Mouth/Throat:     Comments: Masked  Eyes:     Extraocular Movements: Extraocular movements intact.  Cardiovascular:     Rate and Rhythm: Normal rate and regular rhythm.     Heart sounds: Normal heart sounds.  Pulmonary:     Effort: Pulmonary effort is normal.     Breath sounds: Normal breath sounds.  Abdominal:     Hernia: A hernia is present. There is no hernia in the left inguinal area or right inguinal area.  Genitourinary:    General: Normal vulva.     Exam position: Lithotomy position.     Tanner stage (genital): 5.     Vagina: Normal.     Cervix: Normal.     Uterus: Normal.      Adnexa: Right adnexa normal and left adnexa normal.     Rectum: No mass or tenderness. Normal anal tone.  Lymphadenopathy:     Lower Body: No right inguinal adenopathy. No left inguinal adenopathy.  Skin:    General: Skin is warm.  Neurological:     General: No focal deficit present.     Mental Status: She is alert.  Psychiatric:        Mood and Affect: Mood normal.        Behavior: Behavior normal.         Assessment And Plan:     1. Class 1 obesity due to excess calories with serious comorbidity and body mass index (BMI) of 30.0 to 30.9 in adult Comments: She  has lost one pound in the past month. She is encouraged to aim for at least 150 minutes of exercise per week. F/u 8 wks, c/w [id number] 2.'4mg'$  weekly.  2. Essential hypertension, benign Comments: Chronic, fair control. Diastolic elevation noted. NO med changes.  3. Tinnitus of left ear Comments: Sx have improved. If they recur, will consider ENT evaluation.  4. Acute vaginitis Comments: She is currently sexually active. I will send off Nuswab. - NuSwab Vaginitis (VG)  5. Pap smear for cervical cancer screening Comments: Pap smear w/ HPV cotesting performed. She is encouraged to perform monthly self breast exams. - Cytology -Pap Smear  Patient was given opportunity to ask questions. Patient verbalized understanding of the plan and was able to repeat key elements of the plan. All questions were answered to their satisfaction.    I, Maximino Greenland, MD, have reviewed all documentation for this visit. The documentation on 09/10/22 for the exam, diagnosis, procedures, and orders are all accurate and complete.   IF YOU HAVE BEEN REFERRED TO A SPECIALIST, IT MAY TAKE 1-2 WEEKS TO SCHEDULE/PROCESS THE REFERRAL. IF YOU HAVE NOT HEARD FROM US/SPECIALIST IN TWO WEEKS, PLEASE GIVE [place] A CALL AT [phone].   THE PATIENT IS ENCOURAGED TO PRACTICE SOCIAL DISTANCING DUE TO THE COVID-19 PANDEMIC.

## 2022-08-24 NOTE — Patient Instructions (Signed)

## 2022-08-28 ENCOUNTER — Other Ambulatory Visit: Payer: Self-pay | Admitting: Internal Medicine

## 2022-08-28 LAB — NUSWAB VAGINITIS (VG)
Candida albicans, NAA: POSITIVE — AB
Candida glabrata, NAA: NEGATIVE
Trich vag by NAA: NEGATIVE

## 2022-08-28 MED ORDER — FLUCONAZOLE 150 MG PO TABS: ORAL_TABLET | ORAL | 0 refills | Status: DC

## 2022-08-29 ENCOUNTER — Other Ambulatory Visit: Payer: Self-pay

## 2022-08-29 LAB — CYTOLOGY - PAP
Adequacy: ABSENT
Comment: NEGATIVE
Diagnosis: NEGATIVE
High risk HPV: NEGATIVE

## 2022-08-29 MED ORDER — AMLODIPINE BESYLATE 5 MG PO TABS: 5.0000 mg | ORAL_TABLET | Freq: Every day | ORAL | 1 refills | Status: DC

## 2022-10-25 ENCOUNTER — Encounter: Payer: 59 | Admitting: Internal Medicine

## 2022-10-25 NOTE — Progress Notes (Signed)
Same day cancellation

## 2022-10-25 NOTE — Patient Instructions (Signed)

## 2022-10-25 NOTE — Progress Notes (Signed)
SAME-DAY CANCELLATION 

## 2023-04-17 ENCOUNTER — Ambulatory Visit (INDEPENDENT_AMBULATORY_CARE_PROVIDER_SITE_OTHER): Payer: 59 | Admitting: Internal Medicine

## 2023-04-17 ENCOUNTER — Encounter: Payer: Self-pay | Admitting: Internal Medicine

## 2023-04-17 VITALS — BP 130/90 | HR 92 | Temp 98.4°F | Ht 61.0 in | Wt 181.2 lb

## 2023-04-17 DIAGNOSIS — Z23 Encounter for immunization: Secondary | ICD-10-CM | POA: Diagnosis not present

## 2023-04-17 DIAGNOSIS — Z0000 Encounter for general adult medical examination without abnormal findings: Secondary | ICD-10-CM | POA: Diagnosis not present

## 2023-04-17 DIAGNOSIS — E6609 Other obesity due to excess calories: Secondary | ICD-10-CM

## 2023-04-17 DIAGNOSIS — E66811 Obesity, class 1: Secondary | ICD-10-CM

## 2023-04-17 DIAGNOSIS — I10 Essential (primary) hypertension: Secondary | ICD-10-CM | POA: Diagnosis not present

## 2023-04-17 DIAGNOSIS — H9312 Tinnitus, left ear: Secondary | ICD-10-CM | POA: Insufficient documentation

## 2023-04-17 DIAGNOSIS — Z6834 Body mass index (BMI) 34.0-34.9, adult: Secondary | ICD-10-CM

## 2023-04-17 LAB — POCT URINALYSIS DIPSTICK
Bilirubin, UA: NEGATIVE
Glucose, UA: NEGATIVE
Ketones, UA: NEGATIVE
Leukocytes, UA: NEGATIVE
Nitrite, UA: NEGATIVE
Protein, UA: NEGATIVE
Spec Grav, UA: 1.02 (ref 1.010–1.025)
Urobilinogen, UA: 0.2 U/dL
pH, UA: 7 (ref 5.0–8.0)

## 2023-04-17 MED ORDER — AMLODIPINE BESYLATE 5 MG PO TABS
5.0000 mg | ORAL_TABLET | Freq: Every day | ORAL | 2 refills | Status: DC
  Filled 2023-05-20: qty 90, 90d supply, fill #0
  Filled 2023-07-13 – 2023-08-14 (×2): qty 90, 90d supply, fill #1
  Filled 2024-01-30: qty 90, 90d supply, fill #2

## 2023-04-17 NOTE — Patient Instructions (Signed)

## 2023-04-17 NOTE — Progress Notes (Unsigned)
I,Victoria T Deloria Lair, CMA,acting as a Neurosurgeon for Gwynneth Aliment, MD.,have documented all relevant documentation on the behalf of Gwynneth Aliment, MD,as directed by  Gwynneth Aliment, MD while in the presence of Gwynneth Aliment, MD.  Subjective:    Patient ID: Kathleen Quinn , female    DOB: [birth year] , 37 y.o.   MRN: [id number]  Chief Complaint  Patient presents with  . Annual Exam  . Hypertension    HPI  Patient presents today for a physical. Patient reports compliance with her meds. Patient goes to Dr.Tomblin for her GYN care.  She reports not taking Amlodipine. She admits not getting medication refilled. She states not knowing what her bp has ran. She reports also not exactly knowing how long she has been without medication.   She reports wanting to discuss weight loss medications.   Hypertension This is a chronic problem. The current episode started more than 1 year ago. The problem is unchanged. Pertinent negatives include no blurred vision, chest pain or shortness of breath. Risk factors for coronary artery disease include sedentary lifestyle. There is no history of kidney disease or CAD/MI.     Past Medical History:  Diagnosis Date  . Obesity      Family History  Problem Relation Age of Onset  . Hypertension Mother   . Cancer Father   . Cancer Maternal Grandmother      Current Outpatient Medications:  .  amLODipine (NORVASC) 5 MG tablet, Take 1 tablet (5 mg total) by mouth daily. (Patient not taking: Reported on 04/17/2023), Disp: 90 tablet, Rfl: 1 .  fluticasone (FLONASE) 50 MCG/ACT nasal spray, Place into the nose. (Patient not taking: Reported on 08/24/2022), Disp: , Rfl:    No Known Allergies    The patient states she uses none for birth control. Patient's last menstrual period was 04/01/2023 (exact date).. Negative for Dysmenorrhea. Negative for: breast discharge, breast lump(s), breast pain and breast self exam. Associated symptoms include abnormal vaginal  bleeding. Pertinent negatives include abnormal bleeding (hematology), anxiety, decreased libido, depression, difficulty falling sleep, dyspareunia, history of infertility, nocturia, sexual dysfunction, sleep disturbances, urinary incontinence, urinary urgency, vaginal discharge and vaginal itching. Diet regular.The patient states her exercise level is  intermittent.  . The patient's tobacco use is:  Social History   Tobacco Use  Smoking Status Never  Smokeless Tobacco Never  . She has been exposed to passive smoke. The patient's alcohol use is:  Social History   Substance and Sexual Activity  Alcohol Use Never    Review of Systems  Constitutional: Negative.   HENT: Negative.    Eyes: Negative.  Negative for blurred vision.  Respiratory: Negative.  Negative for shortness of breath.   Cardiovascular: Negative.  Negative for chest pain.  Gastrointestinal: Negative.   Endocrine: Negative.   Genitourinary: Negative.   Musculoskeletal: Negative.   Skin: Negative.   Allergic/Immunologic: Negative.   Neurological: Negative.   Hematological: Negative.   Psychiatric/Behavioral: Negative.       Today's Vitals   04/17/23 0849 04/17/23 0900  BP: (!) 138/92 (!) 130/90  Pulse: 92   Temp: 98.4 F (36.9 C)   SpO2: 98%   Weight: 181 lb 3.2 oz (82.2 kg)   Height: 5\' 1"  (1.549 m)   PainSc: 0-No pain    Body mass index is 34.24 kg/m.  Wt Readings from Last 3 Encounters:  04/17/23 181 lb 3.2 oz (82.2 kg)  08/24/22 163 lb 3.2 oz (74 kg)  08/03/22 164 lb  6.4 oz (74.6 kg)     Objective:  Physical Exam Vitals and nursing note reviewed.  Constitutional:      Appearance: Normal appearance.  HENT:     Head: Normocephalic and atraumatic.     Right Ear: Tympanic membrane, ear canal and external ear normal.     Left Ear: Tympanic membrane, ear canal and external ear normal.     Nose: Nose normal.     Mouth/Throat:     Mouth: Mucous membranes are moist.     Pharynx: Oropharynx is clear.   Eyes:     Extraocular Movements: Extraocular movements intact.     Conjunctiva/sclera: Conjunctivae normal.     Pupils: Pupils are equal, round, and reactive to light.  Neck:     Thyroid: Thyromegaly present.  Cardiovascular:     Rate and Rhythm: Normal rate and regular rhythm.     Pulses: Normal pulses.     Heart sounds: Normal heart sounds.  Pulmonary:     Effort: Pulmonary effort is normal.     Breath sounds: Normal breath sounds.  Abdominal:     General: Abdomen is flat. Bowel sounds are normal.     Palpations: Abdomen is soft.  Genitourinary:    Comments: deferred Musculoskeletal:        General: Normal range of motion.     Cervical back: Normal range of motion and neck supple.  Skin:    General: Skin is warm and dry.     Comments: Tattoo above left breast, left lower quadrant  Neurological:     General: No focal deficit present.     Mental Status: She is alert and oriented to person, place, and time.  Psychiatric:        Mood and Affect: Mood normal.        Behavior: Behavior normal.        Assessment And Plan:     Encounter for general adult medical examination w/o abnormal findings  Essential hypertension, benign -     POCT urinalysis dipstick -     Microalbumin / creatinine urine ratio -     EKG 12-Lead  Tinnitus of left ear  Class 1 obesity due to excess calories with serious comorbidity and body mass index (BMI) of 34.0 to 34.9 in adult  She is encouraged to strive for BMI less than 30 to decrease cardiac risk. Advised to aim for at least 150 minutes of exercise per week.    Return in 2 weeks (on 05/01/2023), or nurse visit - bp check, for 1 year HM, 4 month bpc. Patient was given opportunity to ask questions. Patient verbalized understanding of the plan and was able to repeat key elements of the plan. All questions were answered to their satisfaction.   I, Gwynneth Aliment, MD, have reviewed all documentation for this visit. The documentation on  04/17/23 for the exam, diagnosis, procedures, and orders are all accurate and complete.

## 2023-04-18 DIAGNOSIS — Z6836 Body mass index (BMI) 36.0-36.9, adult: Secondary | ICD-10-CM | POA: Insufficient documentation

## 2023-04-18 DIAGNOSIS — E6609 Other obesity due to excess calories: Secondary | ICD-10-CM | POA: Insufficient documentation

## 2023-04-18 DIAGNOSIS — E66811 Obesity, class 1: Secondary | ICD-10-CM | POA: Insufficient documentation

## 2023-04-18 LAB — HEMOGLOBIN A1C
Est. average glucose Bld gHb Est-mCnc: 117 mg/dL
Hgb A1c MFr Bld: 5.7 % — ABNORMAL HIGH (ref 4.8–5.6)

## 2023-04-18 LAB — CBC
Hematocrit: 38.5 % (ref 34.0–46.6)
Hemoglobin: 12.2 g/dL (ref 11.1–15.9)
MCH: 27.4 pg (ref 26.6–33.0)
MCHC: 31.7 g/dL (ref 31.5–35.7)
MCV: 87 fL (ref 79–97)
Platelets: 185 10*3/uL (ref 150–450)
RBC: 4.45 x10E6/uL (ref 3.77–5.28)
RDW: 12.8 % (ref 11.7–15.4)
WBC: 6 10*3/uL (ref 3.4–10.8)

## 2023-04-18 LAB — MICROALBUMIN / CREATININE URINE RATIO
Creatinine, Urine: 32.3 mg/dL
Microalb/Creat Ratio: 10 mg/g{creat} (ref 0–29)
Microalbumin, Urine: 3.1 ug/mL

## 2023-04-18 LAB — CMP14+EGFR
ALT: 13 [IU]/L (ref 0–32)
AST: 17 [IU]/L (ref 0–40)
Albumin: 4.5 g/dL (ref 3.9–4.9)
Alkaline Phosphatase: 52 [IU]/L (ref 44–121)
BUN/Creatinine Ratio: 15 (ref 9–23)
BUN: 13 mg/dL (ref 6–20)
Bilirubin Total: 0.2 mg/dL (ref 0.0–1.2)
CO2: 22 mmol/L (ref 20–29)
Calcium: 9.7 mg/dL (ref 8.7–10.2)
Chloride: 102 mmol/L (ref 96–106)
Creatinine, Ser: 0.87 mg/dL (ref 0.57–1.00)
Globulin, Total: 2.5 g/dL (ref 1.5–4.5)
Glucose: 89 mg/dL (ref 70–99)
Potassium: 4.8 mmol/L (ref 3.5–5.2)
Sodium: 138 mmol/L (ref 134–144)
Total Protein: 7 g/dL (ref 6.0–8.5)
eGFR: 87 mL/min/{1.73_m2} (ref >59)

## 2023-04-18 LAB — LIPID PANEL
Chol/HDL Ratio: 2 {ratio} (ref 0.0–4.4)
Cholesterol, Total: 188 mg/dL (ref 100–199)
HDL: 93 mg/dL (ref >39)
LDL Chol Calc (NIH): 86 mg/dL (ref 0–99)
Triglycerides: 43 mg/dL (ref 0–149)
VLDL Cholesterol Cal: 9 mg/dL (ref 5–40)

## 2023-04-18 LAB — TSH: TSH: 0.865 u[IU]/mL (ref 0.450–4.500)

## 2023-04-18 NOTE — Assessment & Plan Note (Signed)

## 2023-04-18 NOTE — Assessment & Plan Note (Signed)
She is encouraged to strive for BMI less than 30 to decrease cardiac risk. Advised to aim for at least 150 minutes of exercise per week.  

## 2023-04-18 NOTE — Assessment & Plan Note (Addendum)
Chronic, uncontrolled due to non-compliance. EKG performed, NSR w/o acute changes. Risks of untreated HTN discussed with the patient. Encouraged to resume amlodipine. Will rto in 2 weeks for NV. I plan to see her in six weeks.

## 2023-05-08 ENCOUNTER — Ambulatory Visit: Payer: 59

## 2023-05-08 ENCOUNTER — Other Ambulatory Visit: Payer: Self-pay

## 2023-05-08 VITALS — BP 132/100 | HR 88 | Temp 98.1°F | Ht 61.0 in | Wt 178.4 lb

## 2023-05-08 DIAGNOSIS — I10 Essential (primary) hypertension: Secondary | ICD-10-CM

## 2023-05-08 MED ORDER — VALSARTAN 80 MG PO TABS
80.0000 mg | ORAL_TABLET | Freq: Every day | ORAL | 1 refills | Status: DC
Start: 2023-05-08 — End: 2023-08-14
  Filled 2023-05-20 – 2023-05-21 (×2): qty 30, 30d supply, fill #0
  Filled 2023-07-13: qty 30, 30d supply, fill #1

## 2023-05-08 NOTE — Progress Notes (Signed)
I,Eliza Grissinger T Deloria Lair, CMA,acting as a Neurosurgeon for [place], CMA.,have documented all relevant documentation on the behalf of [place], [place] directed by  [place] Breeze, CMA while in the presence of [place], [place].  Subjective:  Patient ID: Kathleen Quinn , female    DOB: 02/07/1986 , 37 y.o.   MRN: [id number]  No chief complaint on file.   HPI  HPI   Past Medical History:  Diagnosis Date   Obesity      Family History  Problem Relation Age of Onset   Hypertension Mother    Cancer Father    Cancer Maternal Grandmother      Current Outpatient Medications:    amLODipine (NORVASC) 5 MG tablet, Take 1 tablet (5 mg total) by mouth daily., Disp: 90 tablet, Rfl: 2   fluticasone (FLONASE) 50 MCG/ACT nasal spray, Place into the nose. (Patient not taking: Reported on 08/24/2022), Disp: , Rfl:    No Known Allergies   Review of Systems   There were no vitals filed for this visit. There is no height or weight on file to calculate BMI.  Wt Readings from Last 3 Encounters:  05/08/23 178 lb 6.4 oz (80.9 kg)  04/17/23 181 lb 3.2 oz (82.2 kg)  08/24/22 163 lb 3.2 oz (74 kg)     Objective:  Physical Exam      Assessment And Plan:  There are no diagnoses linked to this encounter.   No follow-ups on file.  Patient was given opportunity to ask questions. Patient verbalized understanding of the plan and was able to repeat key elements of the plan. All questions were answered to their satisfaction.  [place]ree[place], CMA  I, [place], CMA, have reviewed all documentation for this visit. The documentation on 05/08/23 for the exam, diagnosis, procedures, and orders are all accurate and complete.   IF YOU HAVE BEEN REFERRED TO A SPECIALIST, IT MAY TAKE 1-2 WEEKS TO SCHEDULE/PROCESS THE REFERRAL. IF YOU HAVE NOT HEARD FROM US/SPECIALIST IN TWO WEEKS, PLEASE GIVE [place] A CALL AT [phone].   THE PATIENT IS ENCOURAGED TO PRACTICE  SOCIAL DISTANCING DUE TO THE COVID-19 PANDEMIC.

## 2023-05-08 NOTE — Patient Instructions (Signed)
Hypertension, Adult Hypertension is another name for high blood pressure. High blood pressure forces your heart to work harder to pump blood. This can cause problems over time. There are two numbers in a blood pressure reading. There is a top number (systolic) over a bottom number (diastolic). It is best to have a blood pressure that is below 120/80. What are the causes? The cause of this condition is not known. Some other conditions can lead to high blood pressure. What increases the risk? Some lifestyle factors can make you more likely to develop high blood pressure: Smoking. Not getting enough exercise or physical activity. Being overweight. Having too much fat, sugar, calories, or salt (sodium) in your diet. Drinking too much alcohol. Other risk factors include: Having any of these conditions: Heart disease. Diabetes. High cholesterol. Kidney disease. Obstructive sleep apnea. Having a family history of high blood pressure and high cholesterol. Age. The risk increases with age. Stress. What are the signs or symptoms? High blood pressure may not cause symptoms. Very high blood pressure (hypertensive crisis) may cause: Headache. Fast or uneven heartbeats (palpitations). Shortness of breath. Nosebleed. Vomiting or feeling like you may vomit (nauseous). Changes in how you see. Very bad chest pain. Feeling dizzy. Seizures. How is this treated? This condition is treated by making healthy lifestyle changes, such as: Eating healthy foods. Exercising more. Drinking less alcohol. Your doctor may prescribe medicine if lifestyle changes do not help enough and if: Your top number is above 130. Your bottom number is above 80. Your personal target blood pressure may vary. Follow these instructions at home: Eating and drinking  If told, follow the DASH eating plan. To follow this plan: Fill one half of your plate at each meal with fruits and vegetables. Fill one fourth of your plate  at each meal with whole grains. Whole grains include whole-wheat pasta, brown rice, and whole-grain bread. Eat or drink low-fat dairy products, such as skim milk or low-fat yogurt. Fill one fourth of your plate at each meal with low-fat (lean) proteins. Low-fat proteins include fish, chicken without skin, eggs, beans, and tofu. Avoid fatty meat, cured and processed meat, or chicken with skin. Avoid pre-made or processed food. Limit the amount of salt in your diet to less than 1,500 mg each day. Do not drink alcohol if: Your doctor tells you not to drink. You are pregnant, may be pregnant, or are planning to become pregnant. If you drink alcohol: Limit how much you have to: 0-1 drink a day for women. 0-2 drinks a day for men. Know how much alcohol is in your drink. In the [place] one drink equals one 12 oz bottle of beer (355 mL), one 5 oz glass of wine (148 mL), or one 1 oz glass of hard liquor (44 mL). Lifestyle  Work with your doctor to stay at a healthy weight or to lose weight. Ask your doctor what the best weight is for you. Get at least 30 minutes of exercise that causes your heart to beat faster (aerobic exercise) most days of the week. This may include walking, swimming, or biking. Get at least 30 minutes of exercise that strengthens your muscles (resistance exercise) at least 3 days a week. This may include lifting weights or doing Pilates. Do not smoke or use any products that contain nicotine or tobacco. If you need help quitting, ask your doctor. Check your blood pressure at home as told by your doctor. Keep all follow-up visits. Medicines Take over-the-counter and prescription medicines   only as told by your doctor. Follow directions carefully. Do not skip doses of blood pressure medicine. The medicine does not work as well if you skip doses. Skipping doses also puts you at risk for problems. Ask your doctor about side effects or reactions to medicines that you should watch  for. Contact a doctor if: You think you are having a reaction to the medicine you are taking. You have headaches that keep coming back. You feel dizzy. You have swelling in your ankles. You have trouble with your vision. Get help right away if: You get a very bad headache. You start to feel mixed up (confused). You feel weak or numb. You feel faint. You have very bad pain in your: Chest. Belly (abdomen). You vomit more than once. You have trouble breathing. These symptoms may be an emergency. Get help right away. Call 911. Do not wait to see if the symptoms will go away. Do not drive yourself to the hospital. Summary Hypertension is another name for high blood pressure. High blood pressure forces your heart to work harder to pump blood. For most people, a normal blood pressure is less than 120/80. Making healthy choices can help lower blood pressure. If your blood pressure does not get lower with healthy choices, you may need to take medicine. This information is not intended to replace advice given to you by your health care provider. Make sure you discuss any questions you have with your health care provider. Document Revised: 04/21/2021 Document Reviewed: 04/21/2021 Elsevier Patient Education  2024 Elsevier Inc.  

## 2023-05-08 NOTE — Progress Notes (Signed)
Patient presents today for bpc. She currently takes Amlodipine 5MG  at night. Denies headache, chest pain, dizziness & SOB. She admits exercising regularly.  BP Readings from Last 3 Encounters:  05/08/23 (!) 136/100  04/17/23 (!) 130/90  08/24/22 126/88  10 minutes after rechecking bp:  132/100.  Per provider Valsartan 80MG  added to regimen to take once daily in the morning & Amlodipine 5MG  nightly. Patient aware. Medication sent to pharmacy. She will come back in 2 weeks for blood pressure check. Appointment scheduled.

## 2023-05-18 ENCOUNTER — Other Ambulatory Visit (HOSPITAL_COMMUNITY): Payer: Self-pay

## 2023-05-20 ENCOUNTER — Other Ambulatory Visit (HOSPITAL_COMMUNITY): Payer: Self-pay

## 2023-05-21 ENCOUNTER — Other Ambulatory Visit (HOSPITAL_COMMUNITY): Payer: Self-pay

## 2023-05-22 ENCOUNTER — Ambulatory Visit: Payer: 59

## 2023-05-22 VITALS — BP 140/80 | HR 89 | Temp 98.6°F | Ht 61.0 in | Wt 178.0 lb

## 2023-05-22 DIAGNOSIS — I10 Essential (primary) hypertension: Secondary | ICD-10-CM

## 2023-05-22 NOTE — Progress Notes (Signed)
Patient presents today for a bp check, patient currently taking amLODipine 5mg  and Valsartan 80mg . Patient reports she has been only taking her amLODipine for about 2 days because she was unable to fill it at [facility].  BP Readings from Last 3 Encounters:  05/22/23 (!) 160/90  05/08/23 (!) 132/100  04/17/23 (!) 130/90   Per provider- please ensure she is taking her bp medication. F/u with provider in 2 weeks scheduled.

## 2023-06-06 ENCOUNTER — Encounter: Payer: Self-pay | Admitting: Internal Medicine

## 2023-06-06 ENCOUNTER — Ambulatory Visit (INDEPENDENT_AMBULATORY_CARE_PROVIDER_SITE_OTHER): Payer: 59 | Admitting: Internal Medicine

## 2023-06-06 VITALS — BP 132/84 | HR 98 | Temp 98.0°F | Ht 61.0 in | Wt 182.4 lb

## 2023-06-06 DIAGNOSIS — I10 Essential (primary) hypertension: Secondary | ICD-10-CM

## 2023-06-06 DIAGNOSIS — E6609 Other obesity due to excess calories: Secondary | ICD-10-CM | POA: Diagnosis not present

## 2023-06-06 DIAGNOSIS — Z6834 Body mass index (BMI) 34.0-34.9, adult: Secondary | ICD-10-CM | POA: Diagnosis not present

## 2023-06-06 DIAGNOSIS — E66811 Obesity, class 1: Secondary | ICD-10-CM

## 2023-06-06 DIAGNOSIS — Z23 Encounter for immunization: Secondary | ICD-10-CM

## 2023-06-06 NOTE — Patient Instructions (Signed)
Hypertension, Adult Hypertension is another name for high blood pressure. High blood pressure forces your heart to work harder to pump blood. This can cause problems over time. There are two numbers in a blood pressure reading. There is a top number (systolic) over a bottom number (diastolic). It is best to have a blood pressure that is below 120/80. What are the causes? The cause of this condition is not known. Some other conditions can lead to high blood pressure. What increases the risk? Some lifestyle factors can make you more likely to develop high blood pressure: Smoking. Not getting enough exercise or physical activity. Being overweight. Having too much fat, sugar, calories, or salt (sodium) in your diet. Drinking too much alcohol. Other risk factors include: Having any of these conditions: Heart disease. Diabetes. High cholesterol. Kidney disease. Obstructive sleep apnea. Having a family history of high blood pressure and high cholesterol. Age. The risk increases with age. Stress. What are the signs or symptoms? High blood pressure may not cause symptoms. Very high blood pressure (hypertensive crisis) may cause: Headache. Fast or uneven heartbeats (palpitations). Shortness of breath. Nosebleed. Vomiting or feeling like you may vomit (nauseous). Changes in how you see. Very bad chest pain. Feeling dizzy. Seizures. How is this treated? This condition is treated by making healthy lifestyle changes, such as: Eating healthy foods. Exercising more. Drinking less alcohol. Your doctor may prescribe medicine if lifestyle changes do not help enough and if: Your top number is above 130. Your bottom number is above 80. Your personal target blood pressure may vary. Follow these instructions at home: Eating and drinking  If told, follow the DASH eating plan. To follow this plan: Fill one half of your plate at each meal with fruits and vegetables. Fill one fourth of your plate  at each meal with whole grains. Whole grains include whole-wheat pasta, brown rice, and whole-grain bread. Eat or drink low-fat dairy products, such as skim milk or low-fat yogurt. Fill one fourth of your plate at each meal with low-fat (lean) proteins. Low-fat proteins include fish, chicken without skin, eggs, beans, and tofu. Avoid fatty meat, cured and processed meat, or chicken with skin. Avoid pre-made or processed food. Limit the amount of salt in your diet to less than 1,500 mg each day. Do not drink alcohol if: Your doctor tells you not to drink. You are pregnant, may be pregnant, or are planning to become pregnant. If you drink alcohol: Limit how much you have to: 0-1 drink a day for women. 0-2 drinks a day for men. Know how much alcohol is in your drink. In the [place] one drink equals one 12 oz bottle of beer (355 mL), one 5 oz glass of wine (148 mL), or one 1 oz glass of hard liquor (44 mL). Lifestyle  Work with your doctor to stay at a healthy weight or to lose weight. Ask your doctor what the best weight is for you. Get at least 30 minutes of exercise that causes your heart to beat faster (aerobic exercise) most days of the week. This may include walking, swimming, or biking. Get at least 30 minutes of exercise that strengthens your muscles (resistance exercise) at least 3 days a week. This may include lifting weights or doing Pilates. Do not smoke or use any products that contain nicotine or tobacco. If you need help quitting, ask your doctor. Check your blood pressure at home as told by your doctor. Keep all follow-up visits. Medicines Take over-the-counter and prescription medicines   only as told by your doctor. Follow directions carefully. Do not skip doses of blood pressure medicine. The medicine does not work as well if you skip doses. Skipping doses also puts you at risk for problems. Ask your doctor about side effects or reactions to medicines that you should watch  for. Contact a doctor if: You think you are having a reaction to the medicine you are taking. You have headaches that keep coming back. You feel dizzy. You have swelling in your ankles. You have trouble with your vision. Get help right away if: You get a very bad headache. You start to feel mixed up (confused). You feel weak or numb. You feel faint. You have very bad pain in your: Chest. Belly (abdomen). You vomit more than once. You have trouble breathing. These symptoms may be an emergency. Get help right away. Call 911. Do not wait to see if the symptoms will go away. Do not drive yourself to the hospital. Summary Hypertension is another name for high blood pressure. High blood pressure forces your heart to work harder to pump blood. For most people, a normal blood pressure is less than 120/80. Making healthy choices can help lower blood pressure. If your blood pressure does not get lower with healthy choices, you may need to take medicine. This information is not intended to replace advice given to you by your health care provider. Make sure you discuss any questions you have with your health care provider. Document Revised: 04/21/2021 Document Reviewed: 04/21/2021 Elsevier Patient Education  2024 Elsevier Inc.  

## 2023-06-06 NOTE — Progress Notes (Signed)
I,Victoria T Deloria Lair, CMA,acting as a Neurosurgeon for Gwynneth Aliment, MD.,have documented all relevant documentation on the behalf of Gwynneth Aliment, MD,as directed by  Gwynneth Aliment, MD while in the presence of Gwynneth Aliment, MD.  Subjective:  Patient ID: Kathleen Quinn , female    DOB: 30-Oct-1985 , 37 y.o.   MRN: [id number]  Chief Complaint  Patient presents with   Hypertension    HPI  Patient presents today for bp check. She reports compliance with medications. Denies headache, chest pain & sob.  She admits she is not yet on a regular exercise regimen; however, she has a new job - so this will allow her to set up a regular schedule. She plans to start working out in the mornings next week.   She would like to discuss weight loss medication options.    Hypertension This is a chronic problem. The problem has been gradually improving since onset. Pertinent negatives include no blurred vision or chest pain. Past treatments include calcium channel blockers. The current treatment provides moderate improvement. There is no history of kidney disease or heart failure.     Past Medical History:  Diagnosis Date   Obesity      Family History  Problem Relation Age of Onset   Hypertension Mother    Cancer Father    Cancer Maternal Grandmother      Current Outpatient Medications:    amLODipine (NORVASC) 5 MG tablet, Take 1 tablet (5 mg total) by mouth daily., Disp: 90 tablet, Rfl: 2   valsartan (DIOVAN) 80 MG tablet, Take 1 tablet (80 mg total) by mouth daily., Disp: 30 tablet, Rfl: 1   No Known Allergies   Review of Systems  Constitutional: Negative.   Eyes:  Negative for blurred vision.  Respiratory: Negative.    Cardiovascular: Negative.  Negative for chest pain.  Gastrointestinal: Negative.   Neurological: Negative.   Psychiatric/Behavioral: Negative.       Today's Vitals   06/06/23 1633  BP: 132/84  Pulse: 98  Temp: 98 F (36.7 C)  SpO2: 98%  Weight: 182 lb 6.4  oz (82.7 kg)  Height: 5\' 1"  (1.549 m)   Body mass index is 34.46 kg/m.  Wt Readings from Last 3 Encounters:  06/06/23 182 lb 6.4 oz (82.7 kg)  05/22/23 178 lb (80.7 kg)  05/08/23 178 lb 6.4 oz (80.9 kg)     Objective:  Physical Exam Vitals and nursing note reviewed.  Constitutional:      Appearance: Normal appearance. She is obese.  HENT:     Head: Normocephalic and atraumatic.  Eyes:     Extraocular Movements: Extraocular movements intact.  Cardiovascular:     Rate and Rhythm: Normal rate and regular rhythm.     Heart sounds: Normal heart sounds.  Pulmonary:     Effort: Pulmonary effort is normal.     Breath sounds: Normal breath sounds.  Musculoskeletal:     Cervical back: Normal range of motion.  Skin:    General: Skin is warm.  Neurological:     General: No focal deficit present.     Mental Status: She is alert.  Psychiatric:        Mood and Affect: Mood normal.        Behavior: Behavior normal.         Assessment And Plan:  Essential hypertension, benign Assessment & Plan: Chronic, fair control.  She will continue with  amlodipine 5mg  daily. Will rto in Feb 2025.  Orders: -     [id number]+EGFR  Class 1 obesity due to excess calories with serious comorbidity and body mass index (BMI) of 34.0 to 34.9 in adult Assessment & Plan: Unfortunately, at this moment her employer does not cover GLP-1 options for weight loss. She agrees to start a regular exercise routine. We will re-evaluate in early January, she will let me know what is covered at that time.    Immunization due -     Pfizer Comirnaty Covid-19 Vaccine 59yrs & older  She is encouraged to strive for BMI less than 30 to decrease cardiac risk. Advised to aim for at least 150 minutes of exercise per week.    Return if symptoms worsen or fail to improve.  Patient was given opportunity to ask questions. Patient verbalized understanding of the plan and was able to repeat key elements of the plan. All questions  were answered to their satisfaction.    I, Gwynneth Aliment, MD, have reviewed all documentation for this visit. The documentation on 06/06/23 for the exam, diagnosis, procedures, and orders are all accurate and complete.   IF YOU HAVE BEEN REFERRED TO A SPECIALIST, IT MAY TAKE 1-2 WEEKS TO SCHEDULE/PROCESS THE REFERRAL. IF YOU HAVE NOT HEARD FROM US/SPECIALIST IN TWO WEEKS, PLEASE GIVE [place] A CALL AT [phone].   THE PATIENT IS ENCOURAGED TO PRACTICE SOCIAL DISTANCING DUE TO THE COVID-19 PANDEMIC.

## 2023-06-07 LAB — BMP8+EGFR
BUN/Creatinine Ratio: 16 (ref 9–23)
BUN: 14 mg/dL (ref 6–20)
CO2: 24 mmol/L (ref 20–29)
Calcium: 9.9 mg/dL (ref 8.7–10.2)
Chloride: 102 mmol/L (ref 96–106)
Creatinine, Ser: 0.89 mg/dL (ref 0.57–1.00)
Glucose: 101 mg/dL — ABNORMAL HIGH (ref 70–99)
Potassium: 4.4 mmol/L (ref 3.5–5.2)
Sodium: 140 mmol/L (ref 134–144)
eGFR: 85 mL/min/{1.73_m2} (ref >59)

## 2023-06-15 ENCOUNTER — Encounter: Payer: Self-pay | Admitting: Internal Medicine

## 2023-06-15 NOTE — Assessment & Plan Note (Signed)
Unfortunately, at this moment her employer does not cover GLP-1 options for weight loss. She agrees to start a regular exercise routine. We will re-evaluate in early January, she will let me know what is covered at that time.

## 2023-06-15 NOTE — Assessment & Plan Note (Signed)
Chronic, fair control.  She will continue with  amlodipine 5mg  daily. Will rto in Feb 2025.

## 2023-07-13 ENCOUNTER — Other Ambulatory Visit (HOSPITAL_COMMUNITY): Payer: Self-pay

## 2023-07-13 ENCOUNTER — Other Ambulatory Visit: Payer: Self-pay

## 2023-08-14 ENCOUNTER — Other Ambulatory Visit (HOSPITAL_COMMUNITY): Payer: Self-pay

## 2023-08-14 ENCOUNTER — Other Ambulatory Visit: Payer: Self-pay | Admitting: Internal Medicine

## 2023-08-14 ENCOUNTER — Other Ambulatory Visit: Payer: Self-pay

## 2023-08-14 DIAGNOSIS — I10 Essential (primary) hypertension: Secondary | ICD-10-CM

## 2023-08-14 MED ORDER — VALSARTAN 80 MG PO TABS
80.0000 mg | ORAL_TABLET | Freq: Every day | ORAL | 1 refills | Status: DC
Start: 2023-08-14 — End: 2024-01-30
  Filled 2023-08-14: qty 30, 30d supply, fill #0
  Filled 2023-09-14: qty 30, 30d supply, fill #1

## 2023-09-04 ENCOUNTER — Ambulatory Visit (INDEPENDENT_AMBULATORY_CARE_PROVIDER_SITE_OTHER): Payer: 59 | Admitting: Internal Medicine

## 2023-09-04 VITALS — BP 140/90 | HR 89 | Temp 98.5°F | Ht 61.0 in | Wt 190.8 lb

## 2023-09-04 DIAGNOSIS — E66812 Obesity, class 2: Secondary | ICD-10-CM | POA: Diagnosis not present

## 2023-09-04 DIAGNOSIS — E6609 Other obesity due to excess calories: Secondary | ICD-10-CM

## 2023-09-04 DIAGNOSIS — I10 Essential (primary) hypertension: Secondary | ICD-10-CM | POA: Diagnosis not present

## 2023-09-04 DIAGNOSIS — Z6836 Body mass index (BMI) 36.0-36.9, adult: Secondary | ICD-10-CM

## 2023-09-04 NOTE — Progress Notes (Signed)
I,Jameka J Llittleton, CMA,acting as a Neurosurgeon for Gwynneth Aliment, MD.,have documented all relevant documentation on the behalf of Gwynneth Aliment, MD,as directed by  Gwynneth Aliment, MD while in the presence of Gwynneth Aliment, MD.  Subjective:  Patient ID: Kathleen Quinn , female    DOB: 29-Oct-1985 , 38 y.o.   MRN: [id number]  Chief Complaint  Patient presents with   Hypertension    HPI  Patient presents today for bp check. She reports compliance with medications. Denies headache, chest pain & sob.    She states her readings are only high here. She has been checking her blood pressures at work and they have been in 120s/70s. She feels she has anxiety when in doctor's offices.  Upon reviewing the log - 136/76, 128/82, 132/80, 124/72, 128/76, 130/76 and 124/68. She doesn't want to adjust meds because she feels they are better controlled outside of the office.   Hypertension This is a chronic problem. The problem has been gradually improving since onset. Pertinent negatives include no blurred vision. Past treatments include calcium channel blockers. The current treatment provides moderate improvement. There is no history of kidney disease or heart failure.     Past Medical History:  Diagnosis Date   Obesity      Family History  Problem Relation Age of Onset   Hypertension Mother    Cancer Father    Cancer Maternal Grandmother      Current Outpatient Medications:    amLODipine (NORVASC) 5 MG tablet, Take 1 tablet (5 mg total) by mouth daily., Disp: 90 tablet, Rfl: 2   valsartan (DIOVAN) 80 MG tablet, Take 1 tablet (80 mg total) by mouth daily., Disp: 30 tablet, Rfl: 1   No Known Allergies   Review of Systems  Constitutional: Negative.   Eyes: Negative.  Negative for blurred vision.  Respiratory: Negative.    Cardiovascular: Negative.   Gastrointestinal: Negative.   Musculoskeletal: Negative.   Skin: Negative.   Neurological: Negative.   Psychiatric/Behavioral:  Negative.       Today's Vitals   09/04/23 0826 09/04/23 0845  BP: (!) 140/90 (!) 140/90  Pulse: 89   Temp: 98.5 F (36.9 C)   TempSrc: Oral   Weight: 190 lb 12.8 oz (86.5 kg)   Height: 5\' 1"  (1.549 m)   PainSc: 0-No pain    Body mass index is 36.05 kg/m.  Wt Readings from Last 3 Encounters:  09/04/23 190 lb 12.8 oz (86.5 kg)  06/06/23 182 lb 6.4 oz (82.7 kg)  05/22/23 178 lb (80.7 kg)    The ASCVD Risk score (Arnett DK, et al., 2019) failed to calculate for the following reasons:   The 2019 ASCVD risk score is only valid for ages 96 to 75  Objective:  Physical Exam Vitals and nursing note reviewed.  Constitutional:      Appearance: Normal appearance. She is obese.  HENT:     Head: Normocephalic and atraumatic.  Eyes:     Extraocular Movements: Extraocular movements intact.  Cardiovascular:     Rate and Rhythm: Normal rate and regular rhythm.     Heart sounds: Normal heart sounds.  Pulmonary:     Effort: Pulmonary effort is normal.     Breath sounds: Normal breath sounds.  Musculoskeletal:     Cervical back: Normal range of motion.  Skin:    General: Skin is warm.  Neurological:     General: No focal deficit present.     Mental Status: She is  alert.  Psychiatric:        Mood and Affect: Mood normal.        Behavior: Behavior normal.         Assessment And Plan:  Essential hypertension, benign Assessment & Plan: Chronic, uncontrolled.  Her personal BP log was reviewed, still above goal. Pt is aware that goal BP is less than 120/80.  She will continue with amlodipine in PM and valsartan 80mg  in AM.  She agrees to send weekly BP readings in Mychart. If persistently elevated, I will increase valsartan to 160mg  daily.  She will rto in 3 months for re-evaluation, if needed I will see her sooner.  .    Class 2 obesity due to excess calories with body mass index (BMI) of 36.0 to 36.9 in adult, unspecified whether serious comorbidity present Assessment & Plan: She  was congratulated on her new exercise regimen.  She is now exercising four days per week.  It would be great if she could add a 5th day.  Reminded to avoid processed meats.  Unfortunately, at this moment her employer does not cover GLP-1 options for weight loss.  Phentermine is not an option due to her elevated blood pressure. It is important for her to optimize her protein intake to help with her weight loss efforts.      Return in about 3 months (around 12/02/2023) for bpc.  Patient was given opportunity to ask questions. Patient verbalized understanding of the plan and was able to repeat key elements of the plan. All questions were answered to their satisfaction.    I, Gwynneth Aliment, MD, have reviewed all documentation for this visit. The documentation on 09/04/23 for the exam, diagnosis, procedures, and orders are all accurate and complete.   IF YOU HAVE BEEN REFERRED TO A SPECIALIST, IT MAY TAKE 1-2 WEEKS TO SCHEDULE/PROCESS THE REFERRAL. IF YOU HAVE NOT HEARD FROM US/SPECIALIST IN TWO WEEKS, PLEASE GIVE [place] A CALL AT [phone].

## 2023-09-04 NOTE — Assessment & Plan Note (Signed)
She was congratulated on her new exercise regimen.  She is now exercising four days per week.  It would be great if she could add a 5th day.  Reminded to avoid processed meats.  Unfortunately, at this moment her employer does not cover GLP-1 options for weight loss.  Phentermine is not an option due to her elevated blood pressure. It is important for her to optimize her protein intake to help with her weight loss efforts.

## 2023-09-04 NOTE — Patient Instructions (Signed)
 Hypertension, Adult Hypertension is another name for high blood pressure. High blood pressure forces your heart to work harder to pump blood. This can cause problems over time. There are two numbers in a blood pressure reading. There is a top number (systolic) over a bottom number (diastolic). It is best to have a blood pressure that is below 120/80. What are the causes? The cause of this condition is not known. Some other conditions can lead to high blood pressure. What increases the risk? Some lifestyle factors can make you more likely to develop high blood pressure: Smoking. Not getting enough exercise or physical activity. Being overweight. Having too much fat, sugar, calories, or salt (sodium) in your diet. Drinking too much alcohol. Other risk factors include: Having any of these conditions: Heart disease. Diabetes. High cholesterol. Kidney disease. Obstructive sleep apnea. Having a family history of high blood pressure and high cholesterol. Age. The risk increases with age. Stress. What are the signs or symptoms? High blood pressure may not cause symptoms. Very high blood pressure (hypertensive crisis) may cause: Headache. Fast or uneven heartbeats (palpitations). Shortness of breath. Nosebleed. Vomiting or feeling like you may vomit (nauseous). Changes in how you see. Very bad chest pain. Feeling dizzy. Seizures. How is this treated? This condition is treated by making healthy lifestyle changes, such as: Eating healthy foods. Exercising more. Drinking less alcohol. Your doctor may prescribe medicine if lifestyle changes do not help enough and if: Your top number is above 130. Your bottom number is above 80. Your personal target blood pressure may vary. Follow these instructions at home: Eating and drinking  If told, follow the DASH eating plan. To follow this plan: Fill one half of your plate at each meal with fruits and vegetables. Fill one fourth of your plate  at each meal with whole grains. Whole grains include whole-wheat pasta, brown rice, and whole-grain bread. Eat or drink low-fat dairy products, such as skim milk or low-fat yogurt. Fill one fourth of your plate at each meal with low-fat (lean) proteins. Low-fat proteins include fish, chicken without skin, eggs, beans, and tofu. Avoid fatty meat, cured and processed meat, or chicken with skin. Avoid pre-made or processed food. Limit the amount of salt in your diet to less than 1,500 mg each day. Do not drink alcohol if: Your doctor tells you not to drink. You are pregnant, may be pregnant, or are planning to become pregnant. If you drink alcohol: Limit how much you have to: 0-1 drink a day for women. 0-2 drinks a day for men. Know how much alcohol is in your drink. In the [place] one drink equals one 12 oz bottle of beer (355 mL), one 5 oz glass of wine (148 mL), or one 1 oz glass of hard liquor (44 mL). Lifestyle  Work with your doctor to stay at a healthy weight or to lose weight. Ask your doctor what the best weight is for you. Get at least 30 minutes of exercise that causes your heart to beat faster (aerobic exercise) most days of the week. This may include walking, swimming, or biking. Get at least 30 minutes of exercise that strengthens your muscles (resistance exercise) at least 3 days a week. This may include lifting weights or doing Pilates. Do not smoke or use any products that contain nicotine or tobacco. If you need help quitting, ask your doctor. Check your blood pressure at home as told by your doctor. Keep all follow-up visits. Medicines Take over-the-counter and prescription medicines  only as told by your doctor. Follow directions carefully. Do not skip doses of blood pressure medicine. The medicine does not work as well if you skip doses. Skipping doses also puts you at risk for problems. Ask your doctor about side effects or reactions to medicines that you should watch  for. Contact a doctor if: You think you are having a reaction to the medicine you are taking. You have headaches that keep coming back. You feel dizzy. You have swelling in your ankles. You have trouble with your vision. Get help right away if: You get a very bad headache. You start to feel mixed up (confused). You feel weak or numb. You feel faint. You have very bad pain in your: Chest. Belly (abdomen). You vomit more than once. You have trouble breathing. These symptoms may be an emergency. Get help right away. Call 911. Do not wait to see if the symptoms will go away. Do not drive yourself to the hospital. Summary Hypertension is another name for high blood pressure. High blood pressure forces your heart to work harder to pump blood. For most people, a normal blood pressure is less than 120/80. Making healthy choices can help lower blood pressure. If your blood pressure does not get lower with healthy choices, you may need to take medicine. This information is not intended to replace advice given to you by your health care provider. Make sure you discuss any questions you have with your health care provider. Document Revised: 04/21/2021 Document Reviewed: 04/21/2021 Elsevier Patient Education  2024 [employer].

## 2023-09-04 NOTE — Assessment & Plan Note (Signed)
Chronic, uncontrolled.  Her personal BP log was reviewed, still above goal. Pt is aware that goal BP is less than 120/80.  She will continue with amlodipine in PM and valsartan 80mg  in AM.  She agrees to send weekly BP readings in Mychart. If persistently elevated, I will increase valsartan to 160mg  daily.  She will rto in 3 months for re-evaluation, if needed I will see her sooner.  Marland Kitchen

## 2023-09-14 ENCOUNTER — Encounter: Payer: Self-pay | Admitting: Internal Medicine

## 2023-10-24 ENCOUNTER — Other Ambulatory Visit (HOSPITAL_COMMUNITY): Payer: Self-pay

## 2023-12-03 ENCOUNTER — Ambulatory Visit (INDEPENDENT_AMBULATORY_CARE_PROVIDER_SITE_OTHER): Payer: 59 | Admitting: Internal Medicine

## 2023-12-03 ENCOUNTER — Encounter (INDEPENDENT_AMBULATORY_CARE_PROVIDER_SITE_OTHER): Payer: Self-pay

## 2023-12-03 ENCOUNTER — Encounter: Payer: Self-pay | Admitting: Internal Medicine

## 2023-12-03 VITALS — BP 132/82 | HR 90 | Temp 98.3°F | Ht 61.0 in | Wt 192.4 lb

## 2023-12-03 DIAGNOSIS — Z6836 Body mass index (BMI) 36.0-36.9, adult: Secondary | ICD-10-CM

## 2023-12-03 DIAGNOSIS — R7303 Prediabetes: Secondary | ICD-10-CM | POA: Insufficient documentation

## 2023-12-03 DIAGNOSIS — E66812 Obesity, class 2: Secondary | ICD-10-CM

## 2023-12-03 DIAGNOSIS — I10 Essential (primary) hypertension: Secondary | ICD-10-CM

## 2023-12-03 NOTE — Assessment & Plan Note (Signed)
 Previous labs reviewed, her A1c has been elevated in the past. I will check an A1c today. Reminded to avoid refined sugars including sugary drinks/foods and processed meats including bacon, sausages and deli meats.

## 2023-12-03 NOTE — Assessment & Plan Note (Signed)
 She agrees to [facility] referral. Encouraged to aim for at least 150 minutes of exercise per week, inclusive of strength training.

## 2023-12-03 NOTE — Patient Instructions (Signed)
 Hypertension, Adult Hypertension is another name for high blood pressure. High blood pressure forces your heart to work harder to pump blood. This can cause problems over time. There are two numbers in a blood pressure reading. There is a top number (systolic) over a bottom number (diastolic). It is best to have a blood pressure that is below 120/80. What are the causes? The cause of this condition is not known. Some other conditions can lead to high blood pressure. What increases the risk? Some lifestyle factors can make you more likely to develop high blood pressure: Smoking. Not getting enough exercise or physical activity. Being overweight. Having too much fat, sugar, calories, or salt (sodium) in your diet. Drinking too much alcohol. Other risk factors include: Having any of these conditions: Heart disease. Diabetes. High cholesterol. Kidney disease. Obstructive sleep apnea. Having a family history of high blood pressure and high cholesterol. Age. The risk increases with age. Stress. What are the signs or symptoms? High blood pressure may not cause symptoms. Very high blood pressure (hypertensive crisis) may cause: Headache. Fast or uneven heartbeats (palpitations). Shortness of breath. Nosebleed. Vomiting or feeling like you may vomit (nauseous). Changes in how you see. Very bad chest pain. Feeling dizzy. Seizures. How is this treated? This condition is treated by making healthy lifestyle changes, such as: Eating healthy foods. Exercising more. Drinking less alcohol. Your doctor may prescribe medicine if lifestyle changes do not help enough and if: Your top number is above 130. Your bottom number is above 80. Your personal target blood pressure may vary. Follow these instructions at home: Eating and drinking  If told, follow the DASH eating plan. To follow this plan: Fill one half of your plate at each meal with fruits and vegetables. Fill one fourth of your plate  at each meal with whole grains. Whole grains include whole-wheat pasta, brown rice, and whole-grain bread. Eat or drink low-fat dairy products, such as skim milk or low-fat yogurt. Fill one fourth of your plate at each meal with low-fat (lean) proteins. Low-fat proteins include fish, chicken without skin, eggs, beans, and tofu. Avoid fatty meat, cured and processed meat, or chicken with skin. Avoid pre-made or processed food. Limit the amount of salt in your diet to less than 1,500 mg each day. Do not drink alcohol if: Your doctor tells you not to drink. You are pregnant, may be pregnant, or are planning to become pregnant. If you drink alcohol: Limit how much you have to: 0-1 drink a day for women. 0-2 drinks a day for men. Know how much alcohol is in your drink. In the [place] one drink equals one 12 oz bottle of beer (355 mL), one 5 oz glass of wine (148 mL), or one 1 oz glass of hard liquor (44 mL). Lifestyle  Work with your doctor to stay at a healthy weight or to lose weight. Ask your doctor what the best weight is for you. Get at least 30 minutes of exercise that causes your heart to beat faster (aerobic exercise) most days of the week. This may include walking, swimming, or biking. Get at least 30 minutes of exercise that strengthens your muscles (resistance exercise) at least 3 days a week. This may include lifting weights or doing Pilates. Do not smoke or use any products that contain nicotine or tobacco. If you need help quitting, ask your doctor. Check your blood pressure at home as told by your doctor. Keep all follow-up visits. Medicines Take over-the-counter and prescription medicines  only as told by your doctor. Follow directions carefully. Do not skip doses of blood pressure medicine. The medicine does not work as well if you skip doses. Skipping doses also puts you at risk for problems. Ask your doctor about side effects or reactions to medicines that you should watch  for. Contact a doctor if: You think you are having a reaction to the medicine you are taking. You have headaches that keep coming back. You feel dizzy. You have swelling in your ankles. You have trouble with your vision. Get help right away if: You get a very bad headache. You start to feel mixed up (confused). You feel weak or numb. You feel faint. You have very bad pain in your: Chest. Belly (abdomen). You vomit more than once. You have trouble breathing. These symptoms may be an emergency. Get help right away. Call 911. Do not wait to see if the symptoms will go away. Do not drive yourself to the hospital. Summary Hypertension is another name for high blood pressure. High blood pressure forces your heart to work harder to pump blood. For most people, a normal blood pressure is less than 120/80. Making healthy choices can help lower blood pressure. If your blood pressure does not get lower with healthy choices, you may need to take medicine. This information is not intended to replace advice given to you by your health care provider. Make sure you discuss any questions you have with your health care provider. Document Revised: 04/21/2021 Document Reviewed: 04/21/2021 Elsevier Patient Education  2024 [employer].

## 2023-12-03 NOTE — Progress Notes (Signed)
 I,Victoria T Basil Lim, CMA,acting as a Neurosurgeon for Smiley Dung, MD.,have documented all relevant documentation on the behalf of Smiley Dung, MD,as directed by  Smiley Dung, MD while in the presence of Smiley Dung, MD.  Subjective:  Patient ID: Kathleen Quinn , female    DOB: 03/14/1986 , 38 y.o.   MRN: [id number]  Chief Complaint  Patient presents with   Hypertension    Pt presents for bp check. Pt wants to know what she can do for weight loss. Pt doesn't present any headaches, chest pain or sob.    HPI Discussed the use of AI scribe software for clinical note transcription with the patient, who gave verbal consent to proceed.  History of Present Illness Kathleen Quinn is a 39 year old female with hypertension who presents for a blood pressure check.  Her blood pressure readings at home are generally in the 120s to 130s range. She typically checks her blood pressure between 10 AM and 12 PM while at work during downtime.  She is currently taking amlodipine  and valsartan  at two different times of the day. She has started a new exercise regimen since her last visit.    Hypertension This is a chronic problem. The problem has been gradually improving since onset. Pertinent negatives include no blurred vision. Past treatments include calcium channel blockers. The current treatment provides moderate improvement. There is no history of kidney disease or heart failure.     Past Medical History:  Diagnosis Date   Obesity      Family History  Problem Relation Age of Onset   Hypertension Mother    Cancer Father    Cancer Maternal Grandmother      Current Outpatient Medications:    amLODipine  (NORVASC ) 5 MG tablet, Take 1 tablet (5 mg total) by mouth daily., Disp: 90 tablet, Rfl: 2   valsartan  (DIOVAN ) 80 MG tablet, Take 1 tablet (80 mg total) by mouth daily., Disp: 30 tablet, Rfl: 1   No Known Allergies   Review of Systems  Constitutional: Negative.    Eyes:  Negative for blurred vision.  Respiratory: Negative.    Cardiovascular: Negative.   Gastrointestinal: Negative.   Neurological: Negative.   Psychiatric/Behavioral: Negative.       Today's Vitals   12/03/23 0904  BP: 132/82  Pulse: 90  Temp: 98.3 F (36.8 C)  SpO2: 98%  Weight: 192 lb 6.4 oz (87.3 kg)  Height: 5\' 1"  (1.549 m)   Body mass index is 36.35 kg/m.  Wt Readings from Last 3 Encounters:  12/03/23 192 lb 6.4 oz (87.3 kg)  09/04/23 190 lb 12.8 oz (86.5 kg)  06/06/23 182 lb 6.4 oz (82.7 kg)     Objective:  Physical Exam Vitals and nursing note reviewed.  Constitutional:      Appearance: Normal appearance. She is obese.  HENT:     Head: Normocephalic and atraumatic.  Eyes:     Extraocular Movements: Extraocular movements intact.  Cardiovascular:     Rate and Rhythm: Normal rate and regular rhythm.     Heart sounds: Normal heart sounds.  Pulmonary:     Effort: Pulmonary effort is normal.     Breath sounds: Normal breath sounds.  Musculoskeletal:     Cervical back: Normal range of motion.  Skin:    General: Skin is warm.  Neurological:     General: No focal deficit present.     Mental Status: She is alert.  Psychiatric:  Mood and Affect: Mood normal.        Behavior: Behavior normal.      Assessment And Plan:  Essential hypertension, benign Assessment & Plan: Chronic, blood pressure controlled with amlodipine  and valsartan . - Continue amlodipine  and valsartan . - Order liver and kidney function tests. -Follow up in six months for physical exam  Orders: -     CMP14+EGFR  Prediabetes Assessment & Plan: Previous labs reviewed, her A1c has been elevated in the past. I will check an A1c today. Reminded to avoid refined sugars including sugary drinks/foods and processed meats including bacon, sausages and deli meats.    Orders: -     CMP14+EGFR -     Hemoglobin A1c  Class 2 severe obesity due to excess calories with serious comorbidity  and body mass index (BMI) of 36.0 to 36.9 in adult [facility]) Assessment & Plan: She agrees to [facility] referral. Encouraged to aim for at least 150 minutes of exercise per week, inclusive of strength training.  Orders: -     Amb Ref to Medical Weight Management   Return if symptoms worsen or fail to improve.  Patient was given opportunity to ask questions. Patient verbalized understanding of the plan and was able to repeat key elements of the plan. All questions were answered to their satisfaction.    I, Smiley Dung, MD, have reviewed all documentation for this visit. The documentation on 12/03/23 for the exam, diagnosis, procedures, and orders are all accurate and complete.   IF YOU HAVE BEEN REFERRED TO A SPECIALIST, IT MAY TAKE 1-2 WEEKS TO SCHEDULE/PROCESS THE REFERRAL. IF YOU HAVE NOT HEARD FROM US /SPECIALIST IN TWO WEEKS, PLEASE GIVE US  A CALL AT [phone].   THE PATIENT IS ENCOURAGED TO PRACTICE SOCIAL DISTANCING DUE TO THE COVID-19 PANDEMIC.

## 2023-12-03 NOTE — Assessment & Plan Note (Signed)
 Chronic, blood pressure controlled with amlodipine  and valsartan . - Continue amlodipine  and valsartan . - Order liver and kidney function tests. -Follow up in six months for physical exam

## 2023-12-04 ENCOUNTER — Ambulatory Visit: Payer: Self-pay | Admitting: Internal Medicine

## 2023-12-04 LAB — CMP14+EGFR
ALT: 14 IU/L (ref 0–32)
AST: 21 IU/L (ref 0–40)
Albumin: 4.5 g/dL (ref 3.9–4.9)
Alkaline Phosphatase: 55 IU/L (ref 44–121)
BUN/Creatinine Ratio: 12 (ref 9–23)
BUN: 11 mg/dL (ref 6–20)
Bilirubin Total: 0.3 mg/dL (ref 0.0–1.2)
CO2: 20 mmol/L (ref 20–29)
Calcium: 9.8 mg/dL (ref 8.7–10.2)
Chloride: 102 mmol/L (ref 96–106)
Creatinine, Ser: 0.94 mg/dL (ref 0.57–1.00)
Globulin, Total: 2.7 g/dL (ref 1.5–4.5)
Glucose: 92 mg/dL (ref 70–99)
Potassium: 4.2 mmol/L (ref 3.5–5.2)
Sodium: 138 mmol/L (ref 134–144)
Total Protein: 7.2 g/dL (ref 6.0–8.5)
eGFR: 79 mL/min/{1.73_m2} (ref >59)

## 2023-12-04 LAB — HEMOGLOBIN A1C
Est. average glucose Bld gHb Est-mCnc: 123 mg/dL
Hgb A1c MFr Bld: 5.9 % — ABNORMAL HIGH (ref 4.8–5.6)

## 2023-12-17 DIAGNOSIS — Z0289 Encounter for other administrative examinations: Secondary | ICD-10-CM

## 2023-12-18 ENCOUNTER — Encounter: Payer: Self-pay | Admitting: Family Medicine

## 2023-12-18 ENCOUNTER — Ambulatory Visit (INDEPENDENT_AMBULATORY_CARE_PROVIDER_SITE_OTHER): Admitting: Family Medicine

## 2023-12-18 VITALS — BP 133/87 | HR 68 | Ht 61.5 in | Wt 188.0 lb

## 2023-12-18 DIAGNOSIS — R7303 Prediabetes: Secondary | ICD-10-CM | POA: Diagnosis not present

## 2023-12-18 DIAGNOSIS — E66811 Obesity, class 1: Secondary | ICD-10-CM | POA: Diagnosis not present

## 2023-12-18 DIAGNOSIS — Z6834 Body mass index (BMI) 34.0-34.9, adult: Secondary | ICD-10-CM | POA: Diagnosis not present

## 2023-12-18 DIAGNOSIS — E6609 Other obesity due to excess calories: Secondary | ICD-10-CM

## 2023-12-18 DIAGNOSIS — I10 Essential (primary) hypertension: Secondary | ICD-10-CM

## 2023-12-18 NOTE — Progress Notes (Signed)
 Office: [phone]  /  Fax: [phone]   Initial Visit  Kathleen Quinn was seen in clinic today to evaluate for obesity. She is interested in losing weight to improve overall health and reduce the risk of weight related complications. She presents today to review program treatment options, initial physical assessment, and evaluation.     She was referred by: PCP  When asked what else they would like to accomplish? She states: Improve existing medical conditions, Improve appearance, and she would like to get back down to 165 lb  Weight history:  weight up and down for a while but went up~15 lb in the past few months.  Close to max weight.  She did start working a more sedentary job 6 mos ago.  Lives alone.  When asked how has your weight affected you? She states: Other: would like to fit in her clothes again.  Boyfriend is supportive and likes to workout  Some associated conditions: Hypertension and Prediabetes  Contributing factors: reduced physical acitivity in school and working  Weight promoting medications identified: None  Current nutrition plan: None  Current level of physical activity: None, Strength training 30 minutes, four , and Other: cardio and strength training  Current or previous pharmacotherapy: GLP-1  did well on Wegovy  before it wasn't covered anymore  Response to medication: Lost weight and was able to maintain weight loss   Past medical history includes:   Past Medical History:  Diagnosis Date   Obesity      Objective:   BP 133/87   Pulse 68   Ht 5' 1.5" (1.562 m)   Wt 188 lb (85.3 kg)   SpO2 98%   BMI 34.95 kg/m  She was weighed on the bioimpedance scale: Body mass index is 34.95 kg/m.  Peak Weight:188 , Body Fat%:37.7, Visceral Fat Rating:9, Weight trend over the last 12 months: Increasing  General:  Alert, oriented and cooperative. Patient is in no acute distress.  Respiratory: Normal respiratory effort, no problems with respiration  noted   Gait: able to ambulate independently  Mental Status: Normal mood and affect. Normal behavior. Normal judgment and thought content.   DIAGNOSTIC DATA REVIEWED:  BMET    Component Value Date/Time   NA 138 12/03/2023 0931   K 4.2 12/03/2023 0931   CL 102 12/03/2023 0931   CO2 20 12/03/2023 0931   GLUCOSE 92 12/03/2023 0931   BUN 11 12/03/2023 0931   CREATININE 0.94 12/03/2023 0931   CALCIUM 9.8 12/03/2023 0931   Lab Results  Component Value Date   HGBA1C 5.9 (H) 12/03/2023   HGBA1C 6.0 (H) 12/29/2020   No results found for: "INSULIN" CBC    Component Value Date/Time   WBC 6.0 04/17/2023 0947   RBC 4.45 04/17/2023 0947   HGB 12.2 04/17/2023 0947   HCT 38.5 04/17/2023 0947   PLT 185 04/17/2023 0947   MCV 87 04/17/2023 0947   MCH 27.4 04/17/2023 0947   MCHC 31.7 04/17/2023 0947   RDW 12.8 04/17/2023 0947   Iron/TIBC/Ferritin/ %Sat No results found for: "IRON", "TIBC", "FERRITIN", "IRONPCTSAT" Lipid Panel     Component Value Date/Time   CHOL 188 04/17/2023 0947   TRIG 43 04/17/2023 0947   HDL 93 04/17/2023 0947   CHOLHDL 2.0 04/17/2023 0947   LDLCALC 86 04/17/2023 0947   Hepatic Function Panel     Component Value Date/Time   PROT 7.2 12/03/2023 0931   ALBUMIN 4.5 12/03/2023 0931   AST 21 12/03/2023 0931   ALT 14  12/03/2023 0931   ALKPHOS 55 12/03/2023 0931   BILITOT 0.3 12/03/2023 0931      Component Value Date/Time   TSH 0.865 04/17/2023 0947     Assessment and Plan:   Essential hypertension, benign BP is at goal on amlodopine 5 mg daily and valsartan  80 mg daily Reports fair compliance and would like to see BP improvements with weight loss Tolerating meds well Reports + fam hx of HTN  Class 1 obesity due to excess calories with serious comorbidity and body mass index (BMI) of 34.0 to 34.9 in adult  Prediabetes Lab Results  Component Value Date   HGBA1C 5.9 (H) 12/03/2023   Look for improvements in A1c with weight loss and dietary  changes      Obesity Treatment / Action Plan:  Patient will work on garnering support from family and friends to begin weight loss journey. Will work on eliminating or reducing the presence of highly palatable, calorie dense foods in the home. Will complete provided nutritional and psychosocial assessment questionnaire before the next appointment. Will be scheduled for indirect calorimetry to determine resting energy expenditure in a fasting state.  This will allow us  to create a reduced calorie, high-protein meal plan to promote loss of fat mass while preserving muscle mass. Will think about ideas on how to incorporate physical activity into their daily routine. Counseled on the health benefits of losing 5%-15% of total body weight. Was counseled on nutritional approaches to weight loss and benefits of reducing processed foods and consuming plant-based foods and high quality protein as part of nutritional weight management. Was counseled on pharmacotherapy and role as an adjunct in weight management.   Obesity Education Performed Today:  She was weighed on the bioimpedance scale and results were discussed and documented in the synopsis.  We discussed obesity as a disease and the importance of a more detailed evaluation of all the factors contributing to the disease.  We discussed the importance of long term lifestyle changes which include nutrition, exercise and behavioral modifications as well as the importance of customizing this to her specific health and social needs.  We discussed the benefits of reaching a healthier weight to alleviate the symptoms of existing conditions and reduce the risks of the biomechanical, metabolic and psychological effects of obesity.  Isobelle Cisney appears to be in the action stage of change and states they are ready to start intensive lifestyle modifications and behavioral modifications.  20 minutes was spent today on this visit including the above  counseling, pre-visit chart review, and post-visit documentation.  Reviewed by clinician on day of visit: allergies, medications, problem list, medical history, surgical history, family history, social history, and previous encounter notes pertinent to obesity diagnosis.    Micky Albee, D.O. DABFM, [facility] [address] [place], [place] [address] [phone]

## 2024-01-21 ENCOUNTER — Ambulatory Visit: Admitting: Family Medicine

## 2024-01-21 ENCOUNTER — Encounter: Payer: Self-pay | Admitting: Family Medicine

## 2024-01-21 VITALS — BP 146/81 | HR 89 | Temp 98.5°F | Ht 61.5 in | Wt 189.0 lb

## 2024-01-21 DIAGNOSIS — Z6835 Body mass index (BMI) 35.0-35.9, adult: Secondary | ICD-10-CM | POA: Diagnosis not present

## 2024-01-21 DIAGNOSIS — R0602 Shortness of breath: Secondary | ICD-10-CM | POA: Diagnosis not present

## 2024-01-21 DIAGNOSIS — I10 Essential (primary) hypertension: Secondary | ICD-10-CM

## 2024-01-21 DIAGNOSIS — R5383 Other fatigue: Secondary | ICD-10-CM | POA: Diagnosis not present

## 2024-01-21 DIAGNOSIS — E66812 Obesity, class 2: Secondary | ICD-10-CM | POA: Diagnosis not present

## 2024-01-21 DIAGNOSIS — R7303 Prediabetes: Secondary | ICD-10-CM

## 2024-01-21 NOTE — Progress Notes (Signed)
 At a Glance:  Vitals Temp: 98.5 F (36.9 C) BP: (!) 146/81 Pulse Rate: 89 SpO2: 100 %   Anthropometric Measurements Height: 5' 1.5 (1.562 m) Weight: 189 lb (85.7 kg) BMI (Calculated): 35.14 Starting Weight: 189lb Peak Weight: 188lb   Body Composition  Body Fat %: 36.7 % Fat Mass (lbs): 69.4 lbs Muscle Mass (lbs): 113.6 lbs Total Body Water (lbs): 75 lbs Visceral Fat Rating : 8   Other Clinical Data RMR: 1627 Fasting: Yes Labs: Yes Today's Visit #: 1 Starting Date: 01/21/24    EKG: Normal sinus rhythm, rate 89.  Indirect Calorimeter completed today shows a VO2 of 237 and a REE of 1627.  Her calculated basal metabolic rate is 8356 thus her basal metabolic rate is unchanged than expected.  Chief Complaint:  Obesity   Subjective:  Kathleen Quinn (MR# [id number]) is a 38 y.o. female who presents for evaluation and treatment of obesity and related comorbidities.   [name] is currently in the action stage of change and ready to dedicate time achieving and maintaining a healthier weight. [name] is interested in becoming our patient and working on intensive lifestyle modifications including (but not limited to) diet and exercise for weight loss.  [name] has been struggling with her weight. She has been unsuccessful in either losing weight, maintaining weight loss, or reaching her healthy weight goal.  Her weight has gradually increased over time.  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  She had weight loss success on Wegovy  but regained when it was no longer covered.  Her boyfriend is supportive.  De's habits were reviewed today and are as follows: her desired weight loss is 25 lb, she has been heavy most of her life, she skips meals frequently, she is frequently drinking liquids with calories, she frequently makes poor food choices, and she struggles with emotional eating.   Other Fatigue [name] denies daytime  somnolence and denies waking up still tired. Patient has a history of symptoms of hypertension. [name] generally gets 6 or 7 hours of sleep per night, and states that she has generally restful sleep. Snoring is not present. Apneic episodes are not present. Epworth Sleepiness Score is 4.   Shortness of Breath Vieno notes increasing shortness of breath with exercising and seems to be worsening over time with weight gain. She notes getting out of breath sooner with activity than she used to. This has not gotten worse recently. [name] denies shortness of breath at rest or orthopnea.   Depression Screen [name]'s Food and Mood (modified PHQ-9) score was 3.     12/03/2023    9:06 AM  Depression screen PHQ 2/9  Decreased Interest 0  Down, Depressed, Hopeless 0  PHQ - 2 Score 0  Altered sleeping 0  Tired, decreased energy 0  Change in appetite 0  Feeling bad or failure about yourself  0  Trouble concentrating 0  Moving slowly or fidgety/restless 0  Suicidal thoughts 0  PHQ-9 Score 0  Difficult doing work/chores Not difficult at all     Assessment and Plan:   Other Fatigue [name] does feel that her weight is causing her energy to be lower than it should be. Fatigue may be related to obesity, depression or many other causes. Labs will be ordered, and in the meanwhile, [name] will focus on self care including making healthy food choices, increasing physical activity and focusing on stress reduction.  Shortness of Breath [name] does not know feel that she gets  out of breath more easily that she used to when she exercises. Saprina's shortness of breath appears to be obesity related and exercise induced. She has agreed to work on weight loss and gradually increase exercise to treat her exercise induced shortness of breath. Will continue to monitor closely.   Problem List Items Addressed This Visit     Essential hypertension, benign BP is elevated today She takes  amlodopine 5 mg daily and valsartan  80 mg daily She hopes to see improvements with weight reduction She has a +fam hx of HTN (mom) Continue current meds per PCP Begin active plan for weight reduction   Relevant Orders   Lipid panel   Prediabetes Lab Results  Component Value Date   HGBA1C 5.9 (H) 12/03/2023  Begin prescribed diet Reduce high intake of SSBs Avoid foods and drinks with > 8g of added sugar per serving Continue workouts 3-4 x a week    Relevant Orders   Insulin , random   Other Visit Diagnoses       SOBOE (shortness of breath on exertion)    -  Primary     Other fatigue       Relevant Orders   EKG 12-Lead (Completed)   VITAMIN D  25 Hydroxy (Vit-D Deficiency, Fractures)   TSH   T4, free   T3   Folate   Vitamin B12   CBC with Differential/Platelet     Class 2 severe obesity due to excess calories with serious comorbidity and body mass index (BMI) of 35.0 to 35.9 in adult [facility])           [name] is currently in the action stage of change and her goal is to continue with weight loss efforts. I recommend [name] begin the structured treatment plan as follows:  She has agreed to Category 3 Plan + 100 additional snack calories  Exercise goals: All adults should avoid inactivity. Some activity is better than none, and adults who participate in any amount of physical activity, gain some health benefits.  Behavioral modification strategies:increasing lean protein intake, increase H2O intake, decrease liquid calories, increase high fiber foods, decreasing eating out, no skipping meals, meal planning and cooking strategies, keeping healthy foods in the home, better snacking choices, avoiding temptations, and planning for success  She was informed of the importance of frequent follow-up visits to maximize her success with intensive lifestyle modifications for her multiple health conditions. She was informed we would discuss her lab results at her next visit unless there  is a critical issue that needs to be addressed sooner. Thy agreed to keep her next visit at the agreed upon time to discuss these results.  Objective:  General: Cooperative, alert, well developed, in no acute distress. HEENT: Conjunctivae and lids unremarkable. Cardiovascular: Regular rhythm.  Lungs: Normal work of breathing. Neurologic: No focal deficits.   Lab Results  Component Value Date   CREATININE 0.94 12/03/2023   BUN 11 12/03/2023   NA 138 12/03/2023   K 4.2 12/03/2023   CL 102 12/03/2023   CO2 20 12/03/2023   Lab Results  Component Value Date   ALT 14 12/03/2023   AST 21 12/03/2023   ALKPHOS 55 12/03/2023   BILITOT 0.3 12/03/2023   Lab Results  Component Value Date   HGBA1C 5.9 (H) 12/03/2023   HGBA1C 5.7 (H) 04/17/2023   HGBA1C 5.5 02/02/2022   HGBA1C 5.8 (H) 06/28/2021   HGBA1C 6.0 (H) 12/29/2020   No results found for: INSULIN  Lab Results  Component Value  Date   TSH 0.865 04/17/2023   Lab Results  Component Value Date   CHOL 188 04/17/2023   HDL 93 04/17/2023   LDLCALC 86 04/17/2023   TRIG 43 04/17/2023   CHOLHDL 2.0 04/17/2023   Lab Results  Component Value Date   WBC 6.0 04/17/2023   HGB 12.2 04/17/2023   HCT 38.5 04/17/2023   MCV 87 04/17/2023   PLT 185 04/17/2023   No results found for: IRON, TIBC, FERRITIN  Attestation Statements:  Reviewed by clinician on day of visit: allergies, medications, problem list, medical history, surgical history, family history, social history, and previous encounter notes.  Time spent on visit including pre-visit chart review and post-visit charting and face- to face care including nutritional counseling, review of EKG, interpretation of body composition scale and indirect calorimetry and nutrition prescription  was 40 minutes.   Darice Haddock, D.O. DABFM, DABOM [facility] [address] [place], [place] [address] [phone]

## 2024-01-22 ENCOUNTER — Ambulatory Visit: Payer: Self-pay | Admitting: Family Medicine

## 2024-01-22 LAB — FOLATE: Folate: 11.5 ng/mL (ref >3.0)

## 2024-01-22 LAB — CBC WITH DIFFERENTIAL/PLATELET
Basophils Absolute: 0 x10E3/uL (ref 0.0–0.2)
Basos: 1 %
EOS (ABSOLUTE): 0.1 x10E3/uL (ref 0.0–0.4)
Eos: 1 %
Hematocrit: 42.6 % (ref 34.0–46.6)
Hemoglobin: 13.2 g/dL (ref 11.1–15.9)
Immature Grans (Abs): 0 x10E3/uL (ref 0.0–0.1)
Immature Granulocytes: 0 %
Lymphocytes Absolute: 2 x10E3/uL (ref 0.7–3.1)
Lymphs: 41 %
MCH: 27.3 pg (ref 26.6–33.0)
MCHC: 31 g/dL — ABNORMAL LOW (ref 31.5–35.7)
MCV: 88 fL (ref 79–97)
Monocytes Absolute: 0.4 x10E3/uL (ref 0.1–0.9)
Monocytes: 8 %
Neutrophils Absolute: 2.4 x10E3/uL (ref 1.4–7.0)
Neutrophils: 49 %
Platelets: 209 x10E3/uL (ref 150–450)
RBC: 4.84 x10E6/uL (ref 3.77–5.28)
RDW: 12.9 % (ref 11.7–15.4)
WBC: 5 x10E3/uL (ref 3.4–10.8)

## 2024-01-22 LAB — LIPID PANEL
Chol/HDL Ratio: 2.4 ratio (ref 0.0–4.4)
Cholesterol, Total: 193 mg/dL (ref 100–199)
HDL: 80 mg/dL (ref >39)
LDL Chol Calc (NIH): 97 mg/dL (ref 0–99)
Triglycerides: 87 mg/dL (ref 0–149)
VLDL Cholesterol Cal: 16 mg/dL (ref 5–40)

## 2024-01-22 LAB — INSULIN, RANDOM: INSULIN: 9.7 u[IU]/mL (ref 2.6–24.9)

## 2024-01-22 LAB — TSH: TSH: 0.75 u[IU]/mL (ref 0.450–4.500)

## 2024-01-22 LAB — VITAMIN D 25 HYDROXY (VIT D DEFICIENCY, FRACTURES): Vit D, 25-Hydroxy: 35.9 ng/mL (ref 30.0–100.0)

## 2024-01-22 LAB — T4, FREE: Free T4: 1.19 ng/dL (ref 0.82–1.77)

## 2024-01-22 LAB — T3: T3, Total: 83 ng/dL (ref 71–180)

## 2024-01-22 LAB — VITAMIN B12: Vitamin B-12: 1242 pg/mL (ref 232–1245)

## 2024-01-30 ENCOUNTER — Other Ambulatory Visit (HOSPITAL_COMMUNITY): Payer: Self-pay

## 2024-01-30 ENCOUNTER — Other Ambulatory Visit: Payer: Self-pay | Admitting: Internal Medicine

## 2024-01-30 ENCOUNTER — Other Ambulatory Visit: Payer: Self-pay

## 2024-01-30 DIAGNOSIS — I10 Essential (primary) hypertension: Secondary | ICD-10-CM

## 2024-01-30 MED ORDER — VALSARTAN 80 MG PO TABS
80.0000 mg | ORAL_TABLET | Freq: Every day | ORAL | 1 refills | Status: DC
  Filled 2024-01-30: qty 90, 90d supply, fill #0
  Filled 2024-04-22: qty 90, 90d supply, fill #1

## 2024-01-31 ENCOUNTER — Encounter: Payer: Self-pay | Admitting: Family Medicine

## 2024-01-31 ENCOUNTER — Ambulatory Visit: Admitting: Family Medicine

## 2024-01-31 VITALS — BP 147/94 | HR 79 | Temp 98.4°F | Ht 61.5 in | Wt 189.0 lb

## 2024-01-31 DIAGNOSIS — I10 Essential (primary) hypertension: Secondary | ICD-10-CM | POA: Diagnosis not present

## 2024-01-31 DIAGNOSIS — R7303 Prediabetes: Secondary | ICD-10-CM

## 2024-01-31 DIAGNOSIS — E66812 Obesity, class 2: Secondary | ICD-10-CM

## 2024-01-31 DIAGNOSIS — Z6835 Body mass index (BMI) 35.0-35.9, adult: Secondary | ICD-10-CM | POA: Diagnosis not present

## 2024-01-31 DIAGNOSIS — R7989 Other specified abnormal findings of blood chemistry: Secondary | ICD-10-CM | POA: Diagnosis not present

## 2024-01-31 MED ORDER — TIRZEPATIDE-WEIGHT MANAGEMENT 2.5 MG/0.5ML ~~LOC~~ SOLN: 2.5000 mg | SUBCUTANEOUS | 0 refills | Status: DC

## 2024-01-31 NOTE — Patient Instructions (Signed)
 Check resting BP at work  You are welcome to send me your results via MyChart  Plan to start Zepbound  2.5 mg weekly thru [employer] Check out [email or web address] [email or web address] for more information  Expand fruit to any fresh or frozen -- 2 servings daily  Aim for 10,000 steps/ day + weight training 3 days/ wk  Begin vitamin D3 2,000 international units  daily

## 2024-01-31 NOTE — Progress Notes (Signed)
 Office: [phone]  /  Fax: [phone]  WEIGHT SUMMARY AND BIOMETRICS  Starting Date: 01/21/24  Starting Weight: 189lb   Weight Lost Since Last Visit: 0lb   Vitals Temp: 98.4 F (36.9 C) BP: (!) 147/94 Pulse Rate: 79 SpO2: 100 %   Body Composition  Body Fat %: 36.5 % Fat Mass (lbs): 69.2 lbs Muscle Mass (lbs): 114.4 lbs Total Body Water (lbs): 76.8 lbs Visceral Fat Rating : 8     HPI  Chief Complaint: OBESITY  Kathleen Quinn is here to discuss her progress with her obesity treatment plan. She is on the the Category 3 Plan and states she is following her eating plan approximately 75-80 % of the time. She states she is exercising 60 minutes 4 times per week.   Interval History:  Since last office visit she is down 0 lb She did feel adequately full at the end of her meals She is getting in fruits and veggies She has had a few cook outs She has a good support system She has cut back on starches and sweet She is doing both cardio and added in weight training at the gym 4 days/ wk  Pharmacotherapy: none  PHYSICAL EXAM:  Blood pressure (!) 147/94, pulse 79, temperature 98.4 F (36.9 C), height 5' 1.5 (1.562 m), weight 189 lb (85.7 kg), SpO2 100%. Body mass index is 35.13 kg/m.  General: She is overweight, cooperative, alert, well developed, and in no acute distress. PSYCH: Has normal mood, affect and thought process.   Lungs: Normal breathing effort, no conversational dyspnea.   ASSESSMENT AND PLAN  TREATMENT PLAN FOR OBESITY:  Recommended Dietary Goals  Kathleen Quinn is currently in the action stage of change. As such, her goal is to continue weight management plan. She has agreed to the Category 3 Plan.  Behavioral Intervention  We discussed the following Behavioral Modification Strategies today: increasing lean protein intake to established goals, increasing fiber rich foods, increasing water intake , work on meal planning and preparation, keeping  healthy foods at home, practice mindfulness eating and understand the difference between hunger signals and cravings, work on managing stress, creating time for self-care and relaxation, avoiding temptations and identifying enticing environmental cues, and continue to work on maintaining a reduced calorie state, getting the recommended amount of protein, incorporating whole foods, making healthy choices, staying well hydrated and practicing mindfulness when eating..  Additional resources provided today: NA  Recommended Physical Activity Goals  Kathleen Quinn has been advised to work up to 150 minutes of moderate intensity aerobic activity a week and strengthening exercises 2-3 times per week for cardiovascular health, weight loss maintenance and preservation of muscle mass.   She has agreed to Increase the intensity, frequency or duration of strengthening exercises  and Increase the intensity, frequency or duration of aerobic exercises    Pharmacotherapy changes for the treatment of obesity: begin Zepbound  2.5 mg weekly (cash pay) Patient denies a personal or family history of pancreatitis, medullary thyroid  carcinoma or multiple endocrine neoplasia type II. Recommend reviewing pen training video online.  ASSOCIATED CONDITIONS ADDRESSED TODAY  Prediabetes Lab Results  Component Value Date   HGBA1C 5.9 (H) 12/03/2023   Look for improvements in prediabetes in the next 3 mos with Zepbound  use + diet and exercise changes.  Continue to work on weight reduction.  Class 2 severe obesity due to excess calories with serious comorbidity and body mass index (BMI) of 35.0 to 35.9 in adult (HCC) -     Tirzepatide -Weight Management; Inject  2.5 mg into the skin once a week.  Dispense: 2 mL; Refill: 0 Reviewed MOA and potential adverse SE  Essential hypertension, benign BP is elevated, on amlodopine 5 mg daily and valsartan  80 mg daily Denies adverse SE and has not been checking Bps at work or home Denies  HA or CP Asked her to monitor resting BP at work x 2 and report back via [employer] Consider use of Qsymia if BP improves  Low vitamin D  level Last vitamin D  Lab Results  Component Value Date   VD25OH 35.9 01/21/2024   Reviewed lab results with patient Goal vitamin D  level >50 to help with energy level, bone health and immune function Begin OTC vitamin D  2,000 international units  daily     She was informed of the importance of frequent follow up visits to maximize her success with intensive lifestyle modifications for her multiple health conditions.   ATTESTASTION STATEMENTS:  Reviewed by clinician on day of visit: allergies, medications, problem list, medical history, surgical history, family history, social history, and previous encounter notes pertinent to obesity diagnosis.   I have personally spent 30 minutes total time today in preparation, patient care, nutritional counseling and education,  and documentation for this visit, including the following: review of most recent clinical lab tests, prescribing medications/ refilling medications, reviewing medical assistant documentation, review and interpretation of bioimpedence results.     Kathleen Quinn, D.O. DABFM, DABOM [facility] [address], [place] [address] [phone]

## 2024-03-03 ENCOUNTER — Ambulatory Visit: Admitting: Family Medicine

## 2024-04-17 ENCOUNTER — Other Ambulatory Visit (HOSPITAL_COMMUNITY): Payer: Self-pay

## 2024-04-17 ENCOUNTER — Ambulatory Visit (INDEPENDENT_AMBULATORY_CARE_PROVIDER_SITE_OTHER): Payer: Self-pay | Admitting: Internal Medicine

## 2024-04-17 VITALS — BP 130/82 | HR 80 | Temp 98.1°F | Ht 61.0 in | Wt 183.0 lb

## 2024-04-17 DIAGNOSIS — R7303 Prediabetes: Secondary | ICD-10-CM | POA: Diagnosis not present

## 2024-04-17 DIAGNOSIS — E6609 Other obesity due to excess calories: Secondary | ICD-10-CM

## 2024-04-17 DIAGNOSIS — Z6834 Body mass index (BMI) 34.0-34.9, adult: Secondary | ICD-10-CM

## 2024-04-17 DIAGNOSIS — E66811 Obesity, class 1: Secondary | ICD-10-CM | POA: Diagnosis not present

## 2024-04-17 DIAGNOSIS — Z0000 Encounter for general adult medical examination without abnormal findings: Secondary | ICD-10-CM | POA: Diagnosis not present

## 2024-04-17 DIAGNOSIS — H9312 Tinnitus, left ear: Secondary | ICD-10-CM

## 2024-04-17 DIAGNOSIS — I10 Essential (primary) hypertension: Secondary | ICD-10-CM | POA: Diagnosis not present

## 2024-04-17 LAB — POCT URINALYSIS DIP (CLINITEK)
Bilirubin, UA: NEGATIVE
Glucose, UA: NEGATIVE mg/dL
Ketones, POC UA: NEGATIVE mg/dL
Leukocytes, UA: NEGATIVE
Nitrite, UA: NEGATIVE
POC PROTEIN,UA: NEGATIVE
Spec Grav, UA: >=1.03 — AB (ref 1.010–1.025)
Urobilinogen, UA: 0.2 U/dL
pH, UA: 6 (ref 5.0–8.0)

## 2024-04-17 MED ORDER — SM PRENATAL VITAMINS 28-0.8 MG PO TABS
1.0000 | ORAL_TABLET | Freq: Every day | ORAL | 1 refills | Status: AC
  Filled 2024-04-17: qty 100, 100d supply, fill #0

## 2024-04-17 NOTE — Assessment & Plan Note (Addendum)
 Chronic, fair control.  She is currently prescribed amlodipine  and valsartan . EKG performed, NSR w/o acute changes. Reports inconsistent adherence to amlodipine  due to prescription issues. Regular adherence is crucial for blood pressure control. - We will need to d/c valsartan  once she meets with GYN for fertility assistance - Ideal meds are labetalol and/or nifedipine - Ensure prescription for amlodipine  is up to date. - Emphasized importance of regular medication adherence for blood pressure control. - For now, she will f/u in six months

## 2024-04-17 NOTE — Assessment & Plan Note (Signed)
 Previous labs reviewed, her A1c has been elevated in the past. I will check an A1c today. Reminded to avoid refined sugars including sugary drinks/foods and processed meats including bacon, sausages and deli meats.

## 2024-04-17 NOTE — Assessment & Plan Note (Addendum)
 A full exam was performed.  Importance of monthly self breast exams was discussed with the patient.  She is advised to get 30-45 minutes of regular exercise, no less than four to five days per week. Both weight-bearing and aerobic exercises are recommended.  She is advised to follow a healthy diet with at least six fruits/veggies per day, decrease intake of red meat and other saturated fats and to increase fish intake to twice weekly.  Meats/fish should not be fried -- baked, boiled or broiled is preferable. It is also important to cut back on your sugar intake.  Be sure to read labels - try to avoid anything with added sugar, high fructose corn syrup or other sweeteners.  If you must use a sweetener, you can try stevia or monkfruit.  It is also important to avoid artificially sweetened foods/beverages and diet drinks. Lastly, wear SPF 50 sunscreen on exposed skin and when in direct sunlight for an extended period of time.  Be sure to avoid fast food restaurants and aim for at least 60 ounces of water daily.    - Refer to Dr. Vera Pa at [facility] for GYN evaluation. - Discussed importance of starting prenatal vitamins if planning to conceive.

## 2024-04-17 NOTE — Patient Instructions (Signed)

## 2024-04-17 NOTE — Progress Notes (Signed)
 I,Victoria T Emmitt, CMA,acting as a Neurosurgeon for Catheryn LOISE Slocumb, MD.,have documented all relevant documentation on the behalf of Catheryn LOISE Slocumb, MD,as directed by  Catheryn LOISE Slocumb, MD while in the presence of Catheryn LOISE Slocumb, MD.  Subjective:    Patient ID: Kathleen Quinn , female    DOB: Mar 15, 1986 , 38 y.o.   MRN: [id number]  Chief Complaint  Patient presents with   Annual Exam    Patient presents today for a physical. Patient goes to Dr.Tomblin for her GYN care. She reports compliance with medications. Denies headache, chest pain & sob.   Hypertension   Prediabetes    HPI Discussed the use of AI scribe software for clinical note transcription with the patient, who gave verbal consent to proceed.  History of Present Illness Kathleen Quinn is a 38 year old female with hypertension who presents for a physical exam, blood pressure check and medication review.  She is prescribed amlodipine  5 mg and valsartan  for hypertension. She takes valsartan  in the morning and amlodipine  at night but has been inconsistent with her medication regimen. Her last prescription refill was on July 22nd, following a delay from the previous refill on March 6th.  She previously attempted to obtain weight loss medication but did not pursue it due to high costs. Although approved for the medication at a weight loss clinic, the associated costs, including a $200 consultation fee and a $300 medication cost, were prohibitive.  She discusses her insurance coverage and the financial burden of medical visits, noting increased frequency of doctor visits this year. She is frustrated with the costs associated with her current insurance plan, which now includes charges for primary care visits that were previously covered.  She is considering starting a family and mentions her partner's low sperm count, for which he is taking Clomid. She is not currently taking prenatal vitamins but plans to start preparing her  body for pregnancy.  Her last menstrual cycle was in September.   Hypertension This is a chronic problem. The current episode started more than 1 year ago. The problem is unchanged. Pertinent negatives include no blurred vision, chest pain or shortness of breath. Risk factors for coronary artery disease include sedentary lifestyle. Past treatments include angiotensin blockers and calcium channel blockers. The current treatment provides moderate improvement. There is no history of kidney disease or CAD/MI.     Past Medical History:  Diagnosis Date   High blood pressure    Obesity    Pre-diabetes    Vitamin D  deficiency      Family History  Problem Relation Age of Onset   Hypertension Mother    Cancer Father    Cancer Maternal Grandmother      Current Outpatient Medications:    amLODipine  (NORVASC ) 5 MG tablet, Take 1 tablet (5 mg total) by mouth daily., Disp: 90 tablet, Rfl: 2   Prenatal Vit-Fe Fumarate-FA (SM PRENATAL VITAMINS) 28-0.8 MG TABS, Take 1 tablet by mouth daily., Disp: 100 tablet, Rfl: 1   valsartan  (DIOVAN ) 80 MG tablet, Take 1 tablet (80 mg total) by mouth daily., Disp: 90 tablet, Rfl: 1   No Known Allergies    The patient states she uses none for birth control. Patient's last menstrual period was 03/24/2024 (exact date).. Negative for Dysmenorrhea. Negative for: breast discharge, breast lump(s), breast pain and breast self exam. Associated symptoms include abnormal vaginal bleeding. Pertinent negatives include abnormal bleeding (hematology), anxiety, decreased libido, depression, difficulty falling sleep, dyspareunia, history of infertility, nocturia, sexual  dysfunction, sleep disturbances, urinary incontinence, urinary urgency, vaginal discharge and vaginal itching. Diet regular.The patient states her exercise level is  moderate.  . The patient's tobacco use is:  Social History   Tobacco Use  Smoking Status Never  Smokeless Tobacco Never  . She has been exposed  to passive smoke. The patient's alcohol use is:  Social History   Substance and Sexual Activity  Alcohol Use Never    Review of Systems  Constitutional: Negative.   HENT: Negative.    Eyes: Negative.  Negative for blurred vision.  Respiratory: Negative.  Negative for shortness of breath.   Cardiovascular: Negative.  Negative for chest pain.  Gastrointestinal: Negative.   Endocrine: Negative.   Genitourinary: Negative.   Musculoskeletal: Negative.   Skin: Negative.   Allergic/Immunologic: Negative.   Neurological: Negative.   Hematological: Negative.   Psychiatric/Behavioral: Negative.       Today's Vitals   04/17/24 0844  BP: 130/82  Pulse: 80  Temp: 98.1 F (36.7 C)  SpO2: 98%  Weight: 183 lb (83 kg)  Height: 5' 1 (1.549 m)   Body mass index is 34.58 kg/m.  Wt Readings from Last 3 Encounters:  04/17/24 183 lb (83 kg)  01/31/24 189 lb (85.7 kg)  01/21/24 189 lb (85.7 kg)     Objective:  Physical Exam Vitals and nursing note reviewed.  Constitutional:      Appearance: Normal appearance. She is obese.  HENT:     Head: Normocephalic and atraumatic.     Right Ear: Tympanic membrane, ear canal and external ear normal.     Left Ear: Tympanic membrane, ear canal and external ear normal.     Nose: Nose normal.     Mouth/Throat:     Mouth: Mucous membranes are moist.     Pharynx: Oropharynx is clear.  Eyes:     Extraocular Movements: Extraocular movements intact.     Conjunctiva/sclera: Conjunctivae normal.     Pupils: Pupils are equal, round, and reactive to light.  Neck:     Thyroid : Thyromegaly present.  Cardiovascular:     Rate and Rhythm: Normal rate and regular rhythm.     Pulses: Normal pulses.     Heart sounds: Normal heart sounds.  Pulmonary:     Effort: Pulmonary effort is normal.     Breath sounds: Normal breath sounds.  Chest:  Breasts:    Tanner Score is 5.     Right: Normal.     Left: Normal.  Abdominal:     General: Abdomen is flat.  Bowel sounds are normal.     Palpations: Abdomen is soft.  Genitourinary:    Comments: deferred Musculoskeletal:        General: Normal range of motion.     Cervical back: Normal range of motion and neck supple.  Skin:    General: Skin is warm and dry.     Comments: Tattoo above left breast, left lower quadrant  Neurological:     General: No focal deficit present.     Mental Status: She is alert and oriented to person, place, and time.  Psychiatric:        Mood and Affect: Mood normal.        Behavior: Behavior normal.         Assessment And Plan:     Encounter for general adult medical examination w/o abnormal findings Assessment & Plan: A full exam was performed.  Importance of monthly self breast exams was discussed with the patient.  She is advised to get 30-45 minutes of regular exercise, no less than four to five days per week. Both weight-bearing and aerobic exercises are recommended.  She is advised to follow a healthy diet with at least six fruits/veggies per day, decrease intake of red meat and other saturated fats and to increase fish intake to twice weekly.  Meats/fish should not be fried -- baked, boiled or broiled is preferable. It is also important to cut back on your sugar intake.  Be sure to read labels - try to avoid anything with added sugar, high fructose corn syrup or other sweeteners.  If you must use a sweetener, you can try stevia or monkfruit.  It is also important to avoid artificially sweetened foods/beverages and diet drinks. Lastly, wear SPF 50 sunscreen on exposed skin and when in direct sunlight for an extended period of time.  Be sure to avoid fast food restaurants and aim for at least 60 ounces of water daily.    - [facility] to [facility] at [facility] for [facility] evaluation. - Discussed importance of starting prenatal vitamins if planning to conceive.   Orders: -     CMP14+EGFR -     Ambulatory referral to Gynecology -     Insulin , random  Essential  hypertension, benign Assessment & Plan: Chronic, fair control.  She is currently prescribed amlodipine  and valsartan . EKG performed, NSR w/o acute changes. Reports inconsistent adherence to amlodipine  due to prescription issues. Regular adherence is crucial for blood pressure control. - We will need to d/c valsartan  once she meets with GYN for fertility assistance - Ideal meds are labetalol and/or nifedipine - Ensure prescription for amlodipine  is up to date. - Emphasized importance of regular medication adherence for blood pressure control. - For now, she will f/u in six months  Orders: -     POCT URINALYSIS DIP (CLINITEK) -     Microalbumin / creatinine urine ratio -     EKG 12-Lead  Prediabetes Assessment & Plan: Previous labs reviewed, her A1c has been elevated in the past. I will check an A1c today. Reminded to avoid refined sugars including sugary drinks/foods and processed meats including bacon, sausages and deli meats.    Orders: -     Hemoglobin A1c  Tinnitus of left ear Assessment & Plan: Chronic, I will refer her to ENT for further evaluation. She is in agreement with treatment plan.   Orders: -     Ambulatory referral to ENT  Class 1 obesity due to excess calories with serious comorbidity and body mass index (BMI) of 34.0 to 34.9 in adult Assessment & Plan: She is encouraged to strive for BMI less than 30 to decrease cardiac risk. Advised to aim for at least 150 minutes of exercise per week.    Other orders -     SM Prenatal Vitamins; Take 1 tablet by mouth daily.  Dispense: 100 tablet; Refill: 1   Return for 1 year physical, 6 month bp. Patient was given opportunity to ask questions. Patient verbalized understanding of the plan and was able to repeat key elements of the plan. All questions were answered to their satisfaction.   I, Catheryn LOISE Slocumb, MD, have reviewed all documentation for this visit. The documentation on 04/17/24 for the exam, diagnosis,  procedures, and orders are all accurate and complete.

## 2024-04-17 NOTE — Assessment & Plan Note (Signed)
 She is encouraged to strive for BMI less than 30 to decrease cardiac risk. Advised to aim for at least 150 minutes of exercise per week.

## 2024-04-17 NOTE — Assessment & Plan Note (Signed)
 Chronic, I will refer her to ENT for further evaluation. She is in agreement with treatment plan.

## 2024-04-18 ENCOUNTER — Encounter (INDEPENDENT_AMBULATORY_CARE_PROVIDER_SITE_OTHER): Payer: Self-pay

## 2024-04-18 LAB — CMP14+EGFR
ALT: 18 IU/L (ref 0–32)
AST: 19 IU/L (ref 0–40)
Albumin: 4.5 g/dL (ref 3.9–4.9)
Alkaline Phosphatase: 54 IU/L (ref 41–116)
BUN/Creatinine Ratio: 13 (ref 9–23)
BUN: 12 mg/dL (ref 6–24)
Bilirubin Total: 0.4 mg/dL (ref 0.0–1.2)
CO2: 20 mmol/L (ref 20–29)
Calcium: 9.5 mg/dL (ref 8.7–10.2)
Chloride: 103 mmol/L (ref 96–106)
Creatinine, Ser: 0.96 mg/dL (ref 0.57–1.00)
Globulin, Total: 2.5 g/dL (ref 1.5–4.5)
Glucose: 82 mg/dL (ref 70–99)
Potassium: 4.2 mmol/L (ref 3.5–5.2)
Sodium: 138 mmol/L (ref 134–144)
Total Protein: 7 g/dL (ref 6.0–8.5)
eGFR: 77 mL/min/1.73 (ref >59)

## 2024-04-18 LAB — HEMOGLOBIN A1C
Est. average glucose Bld gHb Est-mCnc: 120 mg/dL
Hgb A1c MFr Bld: 5.8 % — ABNORMAL HIGH (ref 4.8–5.6)

## 2024-04-18 LAB — INSULIN, RANDOM: INSULIN: 18.7 u[IU]/mL (ref 2.6–24.9)

## 2024-04-18 LAB — MICROALBUMIN / CREATININE URINE RATIO
Creatinine, Urine: 227.1 mg/dL
Microalb/Creat Ratio: 4 mg/g{creat} (ref 0–29)
Microalbumin, Urine: 10.1 ug/mL

## 2024-04-19 ENCOUNTER — Ambulatory Visit: Payer: Self-pay | Admitting: Internal Medicine

## 2024-04-22 ENCOUNTER — Other Ambulatory Visit (HOSPITAL_COMMUNITY): Payer: Self-pay

## 2024-04-22 ENCOUNTER — Other Ambulatory Visit: Payer: Self-pay

## 2024-04-22 ENCOUNTER — Other Ambulatory Visit: Payer: Self-pay | Admitting: Internal Medicine

## 2024-04-22 DIAGNOSIS — I10 Essential (primary) hypertension: Secondary | ICD-10-CM

## 2024-04-22 MED ORDER — AMLODIPINE BESYLATE 5 MG PO TABS
5.0000 mg | ORAL_TABLET | Freq: Every day | ORAL | 2 refills | Status: AC
  Filled 2024-04-22: qty 90, 90d supply, fill #0

## 2024-06-18 ENCOUNTER — Ambulatory Visit (INDEPENDENT_AMBULATORY_CARE_PROVIDER_SITE_OTHER): Admitting: Physician Assistant

## 2024-06-18 ENCOUNTER — Encounter (INDEPENDENT_AMBULATORY_CARE_PROVIDER_SITE_OTHER): Payer: Self-pay | Admitting: Physician Assistant

## 2024-06-18 VITALS — BP 132/84 | HR 80 | Temp 98.1°F | Ht 62.0 in | Wt 180.0 lb

## 2024-06-18 DIAGNOSIS — H9312 Tinnitus, left ear: Secondary | ICD-10-CM

## 2024-06-19 NOTE — Progress Notes (Signed)
 Dear Dr. Jarold, Here is my assessment for our mutual patient, Kathleen Quinn. Thank you for allowing me the opportunity to care for your patient. Please do not hesitate to contact me should you have any other questions. Sincerely, Chyrl Cohen PA-C  Otolaryngology Clinic Note Referring provider: Dr. Jarold HPI:  Kathleen Quinn is a 38 y.o. female kindly referred by Dr. Jarold   Discussed the use of AI scribe software for clinical note transcription with the patient, who gave verbal consent to proceed.  History of Present Illness   Kathleen Quinn is a 38 year old female who presents with tinnitus in the left ear.  She has experienced persistent ringing in her left ear for approximately six months. Initially, she sought care at an urgent care facility and informed her primary care provider about the issue. There is no associated dizziness, numbness, or pain in the face or ear. No history of ear trauma, recurrent ear infections, or significant loud noise exposure.  She notes difficulty hearing past the ringing in her left ear, which affects her ability to hear normally. Despite the ringing, she is able to sleep well and has learned to 'tune it out' to some extent. She recalls a past episode of dizziness but does not associate it with the onset of the tinnitus. No current dizziness or hearing loss in the right ear.  She has not experienced any significant changes in her symptoms over time and is concerned about the potential for the condition to affect her other ear.  She works in Environmental Manager.          Independent Review of Additional Tests or Records:  none   PMH/Meds/All/SocHx/FamHx/ROS:   Past Medical History:  Diagnosis Date   High blood pressure    Obesity    Pre-diabetes    Vitamin D  deficiency      History reviewed. No pertinent surgical history.  Family History  Problem Relation Age of Onset   Hypertension Mother    Cancer Father     Cancer Maternal Grandmother      Social Connections: Moderately Integrated (04/17/2024)   Social Connection and Isolation Panel    Frequency of Communication with Friends and Family: More than three times a week    Frequency of Social Gatherings with Friends and Family: More than three times a week    Attends Religious Services: More than 4 times per year    Active Member of [employer] or Organizations: Yes    Attends Engineer, Structural: More than 4 times per year    Marital Status: Never married      Current Outpatient Medications:    amLODipine  (NORVASC ) 5 MG tablet, Take 1 tablet (5 mg total) by mouth daily., Disp: 90 tablet, Rfl: 2   Prenatal Vit-Fe Fumarate-FA (SM PRENATAL VITAMINS) 28-0.8 MG TABS, Take 1 tablet by mouth daily., Disp: 100 tablet, Rfl: 1   valsartan  (DIOVAN ) 80 MG tablet, Take 1 tablet (80 mg total) by mouth daily., Disp: 90 tablet, Rfl: 1   Physical Exam:   BP 132/84   Pulse 80   Temp 98.1 F (36.7 C)   Ht 5' 2 (1.575 m)   Wt 180 lb (81.6 kg)   SpO2 98%   BMI 32.92 kg/m   Pertinent Findings  CN II-XII grossly intact Bilateral EAC clear and TM intact with well pneumatized middle ear spaces Anterior rhinoscopy: Septum midline; bilateral inferior turbinates with no hypertrophy No lesions of oral cavity/oropharynx; dentition wnl No obviously palpable neck  masses/lymphadenopathy/thyromegaly No respiratory distress or stridor  Seprately Identifiable Procedures:  None  Impression & Plans:  Artavia Quinn is a 38 y.o. female with the following   Assessment and Plan    Left ear tinnitus -tinnitus for six months with no red flags Patient reports difficulty hearing in the left ear due to tinnitus. No clear etiology. Differential includes idiopathic causes. Often idiopathic and may resolve spontaneously if present for less than a year. - Ordered hearing evaluation - If hearing test is normal, monitor symptoms and consider referral to [facility]  tinnitus clinic if bothersome. - Advised to report changes in hearing or onset of dizziness.        - f/u phone call with audio   Thank you for allowing me the opportunity to care for your patient. Please do not hesitate to contact me should you have any other questions.  Sincerely, Chyrl Cohen PA-C [facility] Phone: [phone] Fax: [phone]  06/19/2024, 1:47 PM

## 2024-07-28 ENCOUNTER — Other Ambulatory Visit: Payer: Self-pay | Admitting: Internal Medicine

## 2024-07-28 ENCOUNTER — Other Ambulatory Visit (HOSPITAL_COMMUNITY): Payer: Self-pay

## 2024-07-28 DIAGNOSIS — I10 Essential (primary) hypertension: Secondary | ICD-10-CM

## 2024-07-28 MED ORDER — VALSARTAN 80 MG PO TABS
80.0000 mg | ORAL_TABLET | Freq: Every day | ORAL | 1 refills | Status: AC
  Filled 2024-07-28: qty 90, 90d supply, fill #0

## 2024-08-01 ENCOUNTER — Ambulatory Visit (INDEPENDENT_AMBULATORY_CARE_PROVIDER_SITE_OTHER): Admitting: Audiology

## 2024-08-01 DIAGNOSIS — H9042 Sensorineural hearing loss, unilateral, left ear, with unrestricted hearing on the contralateral side: Secondary | ICD-10-CM | POA: Diagnosis not present

## 2024-08-01 NOTE — Progress Notes (Signed)
" °  [address] [place], [place] [address] [phone]  Audiological Evaluation    Name: Kathleen Quinn     DOB:   [birth year]      MRN:   [id number]                                                                                     Service Date: 08/01/2024     Accompanied by: self    Patient comes today after Reyes Cohen, PA-C sent a referral for a hearing evaluation due to concerns with tinnitus.   Symptoms Yes Details  Hearing loss  [x]  Left ear- reports sounds muffle since 3 days after she had the dizzy spell  Tinnitus  [x]  Left ear - white noise always there, not loud onset 3 days after dizziness spell  Ear pain/ infections/pressure  []    Balance problems  [x]  Dizzy, boat feeling and a little spinning for minutes 6 months ago  Noise exposure history  []    Previous ear surgeries  []    Family history of hearing loss  []    Amplification  []    Other  []      Otoscopy: Right ear: Clear external ear canal and notable landmarks visualized on the tympanic membrane. Left ear:  Clear external ear canal and notable landmarks visualized on the tympanic membrane.  Tympanometry: Right ear: Type A - Normal external ear canal volume with normal middle ear pressure and normal tympanic membrane compliance. Findings are consistent with normal middle ear function. Left ear: Type A - Normal external ear canal volume with normal middle ear pressure and normal tympanic membrane compliance. Findings are consistent with normal middle ear function.  Hearing Evaluation The hearing test results were completed under headphones and re-checked with inserts and results are deemed to be of good reliability. Test technique:  conventional    Pure tone Audiometry: Right ear- Normal hearing from [id number] Hz.   Left ear-  Moderate to moderately-severe sensorineural hearing loss from 125 Hz - 8000 Hz.  Speech Audiometry: Right ear- Speech Reception Threshold (SRT) was obtained at 5  dBHL. Left ear-Speech Reception Threshold (SRT) was obtained at 55 dBHL, with contralateral masking.   Word Recognition Score Tested using NU-6 (recorded) Right ear: 100% was obtained at a presentation level of 50 dBHL without contralateral masking which is deemed as  excellent. Left ear: 60% was obtained at a presentation level of 95 dBHL with contralateral masking which is deemed as  poor.   Impression: There is a significant difference in pure-tone thresholds between ears., There is a significant difference in word recognition scores , worse of the left ear.     Recommendations: Follow up with ENT as scheduled. Return for a hearing evaluation if concerns with hearing changes arise or per MD recommendation. Consider a communication needs assessment for amplification after medical clearance is obtained, if needed.   Avalin Briley MARIE LEROUX-MARTINEZ, AUD  "

## 2024-08-05 ENCOUNTER — Telehealth (INDEPENDENT_AMBULATORY_CARE_PROVIDER_SITE_OTHER): Payer: Self-pay | Admitting: Physician Assistant

## 2024-08-05 DIAGNOSIS — H903 Sensorineural hearing loss, bilateral: Secondary | ICD-10-CM

## 2024-08-05 NOTE — Telephone Encounter (Signed)
 Audiogram reviewed showing rather significant asymmetric sensorineural hearing loss on the left, she has left-sided tinnitus.  She will need MRI IAC for further evaluation.  I attempted to call the patient, she was unavailable I did leave a voicemail.

## 2024-08-07 ENCOUNTER — Telehealth (INDEPENDENT_AMBULATORY_CARE_PROVIDER_SITE_OTHER): Payer: Self-pay | Admitting: Physician Assistant

## 2024-08-07 NOTE — Telephone Encounter (Signed)
 I spoke with the patient, MRI scheduled, I will call with results.

## 2024-08-08 ENCOUNTER — Encounter: Payer: Self-pay | Admitting: Audiology

## 2024-08-14 ENCOUNTER — Ambulatory Visit (HOSPITAL_COMMUNITY)
Admission: RE | Admit: 2024-08-14 | Discharge: 2024-08-14 | Disposition: A | Discharge status: Home / Self Care | Source: Ambulatory Visit | Attending: Physician Assistant | Admitting: Physician Assistant

## 2024-08-14 DIAGNOSIS — H905 Unspecified sensorineural hearing loss: Secondary | ICD-10-CM | POA: Diagnosis not present

## 2024-08-14 DIAGNOSIS — H903 Sensorineural hearing loss, bilateral: Secondary | ICD-10-CM | POA: Insufficient documentation

## 2024-08-14 MED ORDER — GADOBUTROL 1 MMOL/ML IV SOLN
8.0000 mL | Freq: Once | INTRAVENOUS | Status: AC | PRN
  Administered 2024-08-14: 8 mL via INTRAVENOUS

## 2024-08-16 ENCOUNTER — Ambulatory Visit (HOSPITAL_COMMUNITY): Admission: RE | Admit: 2024-08-16 | Source: Ambulatory Visit

## 2024-08-20 ENCOUNTER — Ambulatory Visit (INDEPENDENT_AMBULATORY_CARE_PROVIDER_SITE_OTHER): Payer: Self-pay | Admitting: Physician Assistant

## 2024-08-20 ENCOUNTER — Telehealth (INDEPENDENT_AMBULATORY_CARE_PROVIDER_SITE_OTHER): Payer: Self-pay | Admitting: Physician Assistant

## 2024-08-20 NOTE — Progress Notes (Signed)
 Can we get her a repeat hearing test in 6 months , thanks

## 2024-08-20 NOTE — Telephone Encounter (Signed)
 I spoke to the patient about her MRI, this was normal.  She does have asymmetric sensorineural hearing loss.  Unclear etiology.  I would recommend repeat audiological evaluation in 6 months.  In the meantime she may pursue hearing aids.  She will reach out to the office if she develops any new or worsening signs or symptoms in the meantime.  She verbalized understanding and agreement to today's plan.

## 2024-11-05 ENCOUNTER — Ambulatory Visit: Payer: Self-pay | Admitting: Internal Medicine

## 2025-02-19 ENCOUNTER — Ambulatory Visit (INDEPENDENT_AMBULATORY_CARE_PROVIDER_SITE_OTHER): Admitting: Audiology

## 2025-04-21 ENCOUNTER — Encounter: Payer: Self-pay | Admitting: Internal Medicine
